# Patient Record
Sex: Male | Born: 1975
Health system: Southern US, Community
[De-identification: ages and names within clinical notes are randomized; demographics above are authoritative.]

## PROBLEM LIST (undated history)

## (undated) DIAGNOSIS — I10 Essential (primary) hypertension: Secondary | ICD-10-CM

## (undated) DIAGNOSIS — F209 Schizophrenia, unspecified: Secondary | ICD-10-CM

## (undated) DIAGNOSIS — R972 Elevated prostate specific antigen [PSA]: Secondary | ICD-10-CM

## (undated) DIAGNOSIS — J45909 Unspecified asthma, uncomplicated: Secondary | ICD-10-CM

## (undated) DIAGNOSIS — E785 Hyperlipidemia, unspecified: Secondary | ICD-10-CM

## (undated) HISTORY — DX: Essential (primary) hypertension: I10

## (undated) HISTORY — DX: Unspecified asthma, uncomplicated: J45.909

## (undated) HISTORY — DX: Schizophrenia, unspecified: F20.9

## (undated) HISTORY — DX: Hyperlipidemia, unspecified: E78.5

---

## 1998-09-02 ENCOUNTER — Emergency Department (HOSPITAL_COMMUNITY): Admission: EM | Admit: 1998-09-02 | Discharge: 1998-09-02 | Payer: Self-pay | Admitting: Emergency Medicine

## 2003-12-26 ENCOUNTER — Emergency Department (HOSPITAL_COMMUNITY): Admission: EM | Admit: 2003-12-26 | Discharge: 2003-12-26 | Payer: Self-pay | Admitting: Family Medicine

## 2005-01-20 ENCOUNTER — Emergency Department (HOSPITAL_COMMUNITY): Admission: EM | Admit: 2005-01-20 | Discharge: 2005-01-20 | Payer: Self-pay | Admitting: Emergency Medicine

## 2005-09-28 IMAGING — CR DG FOREARM 2V*R*
2 series · 2 of 2 positions shown · non-contrast
Comparison: none

CLINICAL DATA: Pain and swelling.  Trauma.
 FOREARM TWO VIEWS, RIGHT
 No fracture, dislocation, or bone destruction.  Joint spaces are preserved.  Mineralization normal.
 IMPRESSION 
 No acute bony abnormalities.

[view not recorded (1 of 2)]
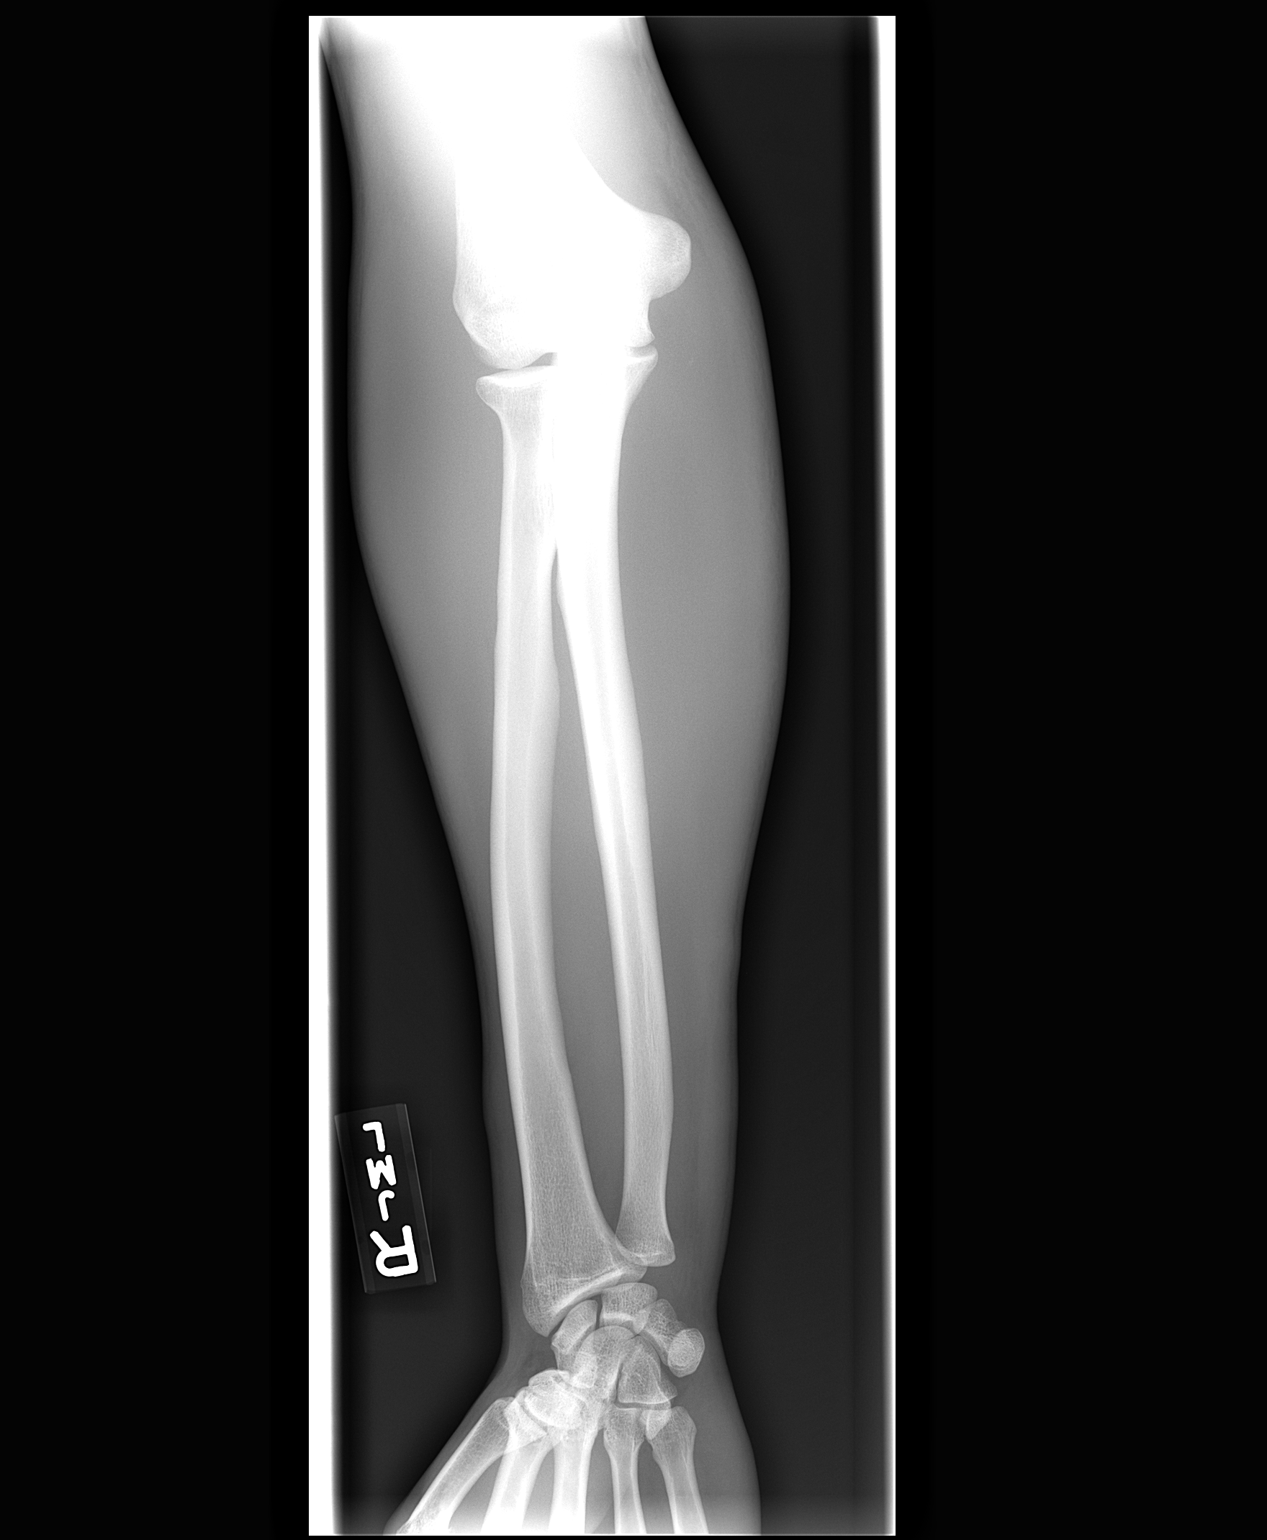

[view not recorded (2 of 2)]
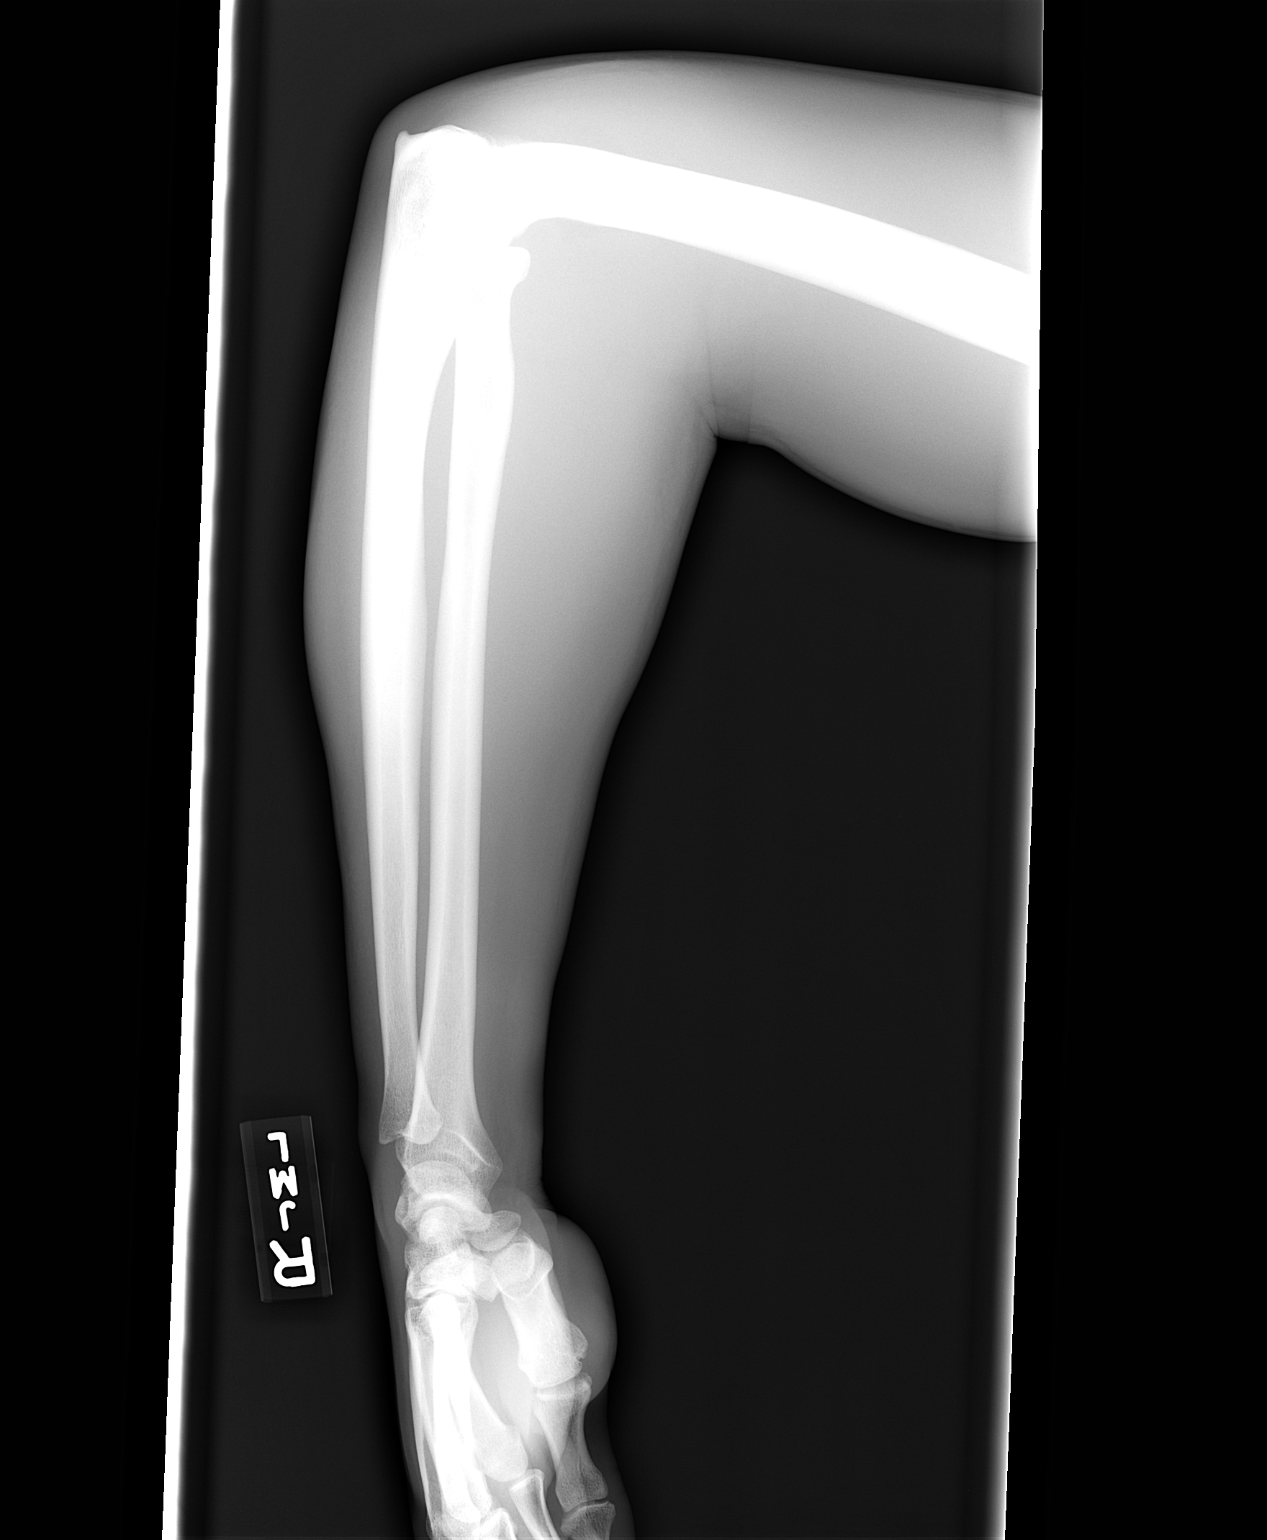

[2 of 2 positions shown; findings below may reference images not displayed]

## 2009-07-30 ENCOUNTER — Ambulatory Visit: Payer: Self-pay | Admitting: Psychiatry

## 2009-07-30 ENCOUNTER — Inpatient Hospital Stay (HOSPITAL_COMMUNITY): Admission: RE | Admit: 2009-07-30 | Discharge: 2009-08-18 | Payer: Self-pay | Admitting: Psychiatry

## 2009-07-30 ENCOUNTER — Emergency Department (HOSPITAL_COMMUNITY): Admission: EM | Admit: 2009-07-30 | Discharge: 2009-07-30 | Payer: Self-pay | Admitting: Emergency Medicine

## 2010-09-25 LAB — CBC
HCT: 48.7 % (ref 39.0–52.0)
MCHC: 33.1 g/dL (ref 30.0–36.0)
MCV: 97.1 fL (ref 78.0–100.0)
Platelets: 382 10*3/uL (ref 150–400)

## 2010-09-25 LAB — RAPID URINE DRUG SCREEN, HOSP PERFORMED
Amphetamines: NOT DETECTED
Benzodiazepines: NOT DETECTED
Cocaine: NOT DETECTED
Opiates: NOT DETECTED

## 2010-09-25 LAB — BASIC METABOLIC PANEL
CO2: 28 mEq/L (ref 19–32)
Calcium: 9.5 mg/dL (ref 8.4–10.5)
Creatinine, Ser: 0.99 mg/dL (ref 0.4–1.5)
Glucose, Bld: 130 mg/dL — ABNORMAL HIGH (ref 70–99)

## 2010-09-25 LAB — DIFFERENTIAL
Basophils Absolute: 0 10*3/uL (ref 0.0–0.1)
Basophils Relative: 1 % (ref 0–1)
Eosinophils Absolute: 0.2 10*3/uL (ref 0.0–0.7)
Monocytes Absolute: 0.7 10*3/uL (ref 0.1–1.0)
Monocytes Relative: 8 % (ref 3–12)

## 2010-09-25 LAB — HEPATIC FUNCTION PANEL
Albumin: 4.3 g/dL (ref 3.5–5.2)
Alkaline Phosphatase: 61 U/L (ref 39–117)
Indirect Bilirubin: 0.5 mg/dL (ref 0.3–0.9)
Total Protein: 8.2 g/dL (ref 6.0–8.3)

## 2010-09-25 LAB — RPR: RPR Ser Ql: NONREACTIVE

## 2011-05-03 IMAGING — CT CT HEAD W/O CM
1 series · 16 of 30 positions shown, 20 images · non-contrast
Comparison: None.

CLINICAL DATA: Medical clearance.

CT HEAD WITHOUT CONTRAST
TECHNIQUE: Contiguous axial images were obtained from the base of
the skull through the vertex without contrast.

[Series 2: headseq 4.8 h45s · axial · 0.43mm/px · z∈[-132,-4]mm · 16 of 30 slices shown, 20 images]
[im 2/30  brain]
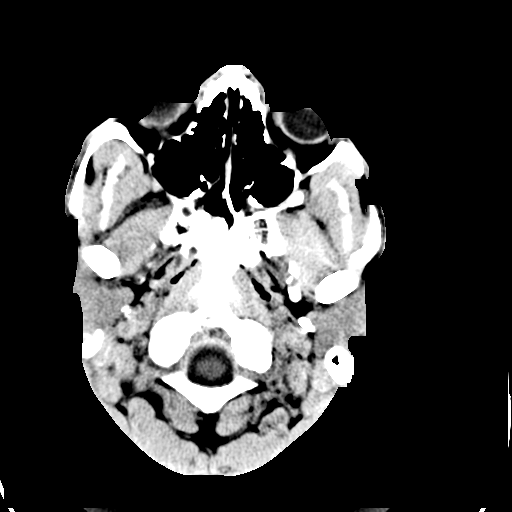
[im 2/30  bone]
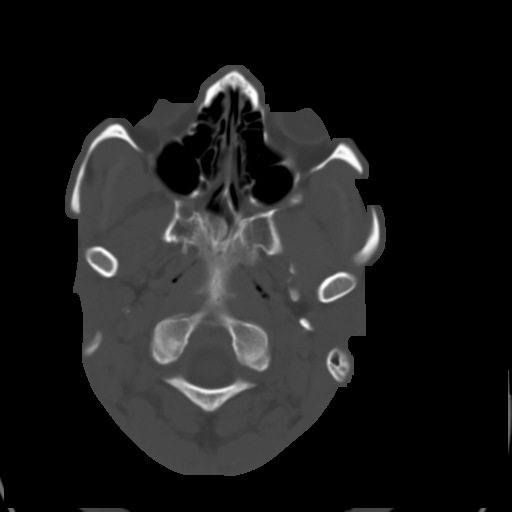
[im 4/30  brain]
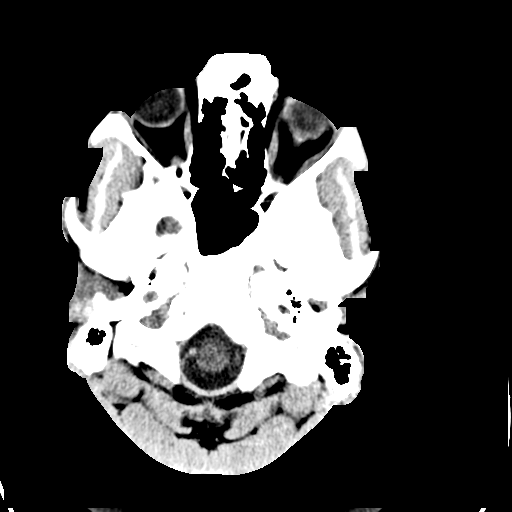
[im 6/30  brain]
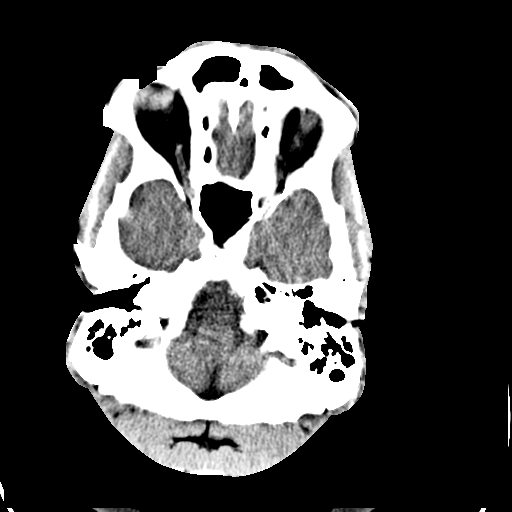
[im 8/30  brain]
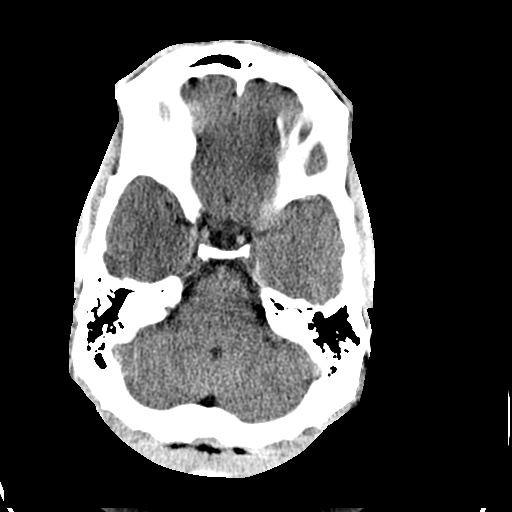
[im 9/30  brain]
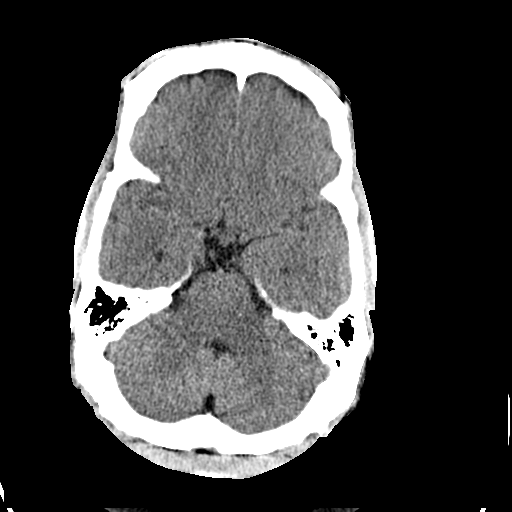
[im 9/30  bone]
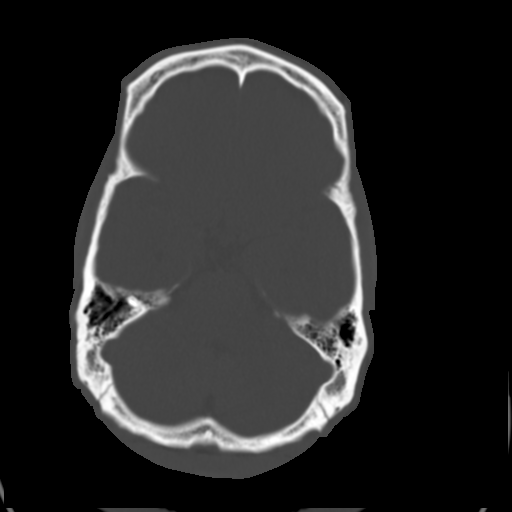
[im 11/30  brain]
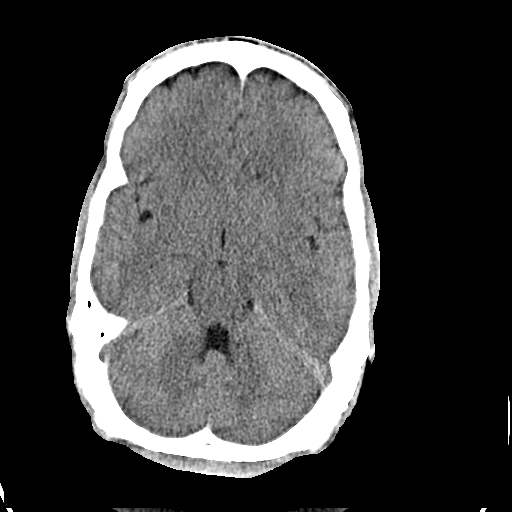
[im 13/30  brain]
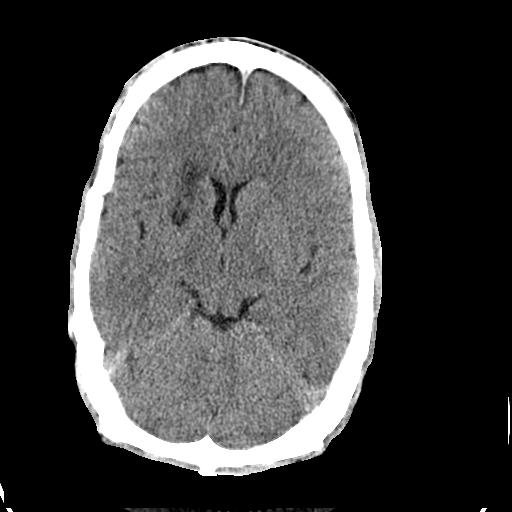
[im 15/30  brain]
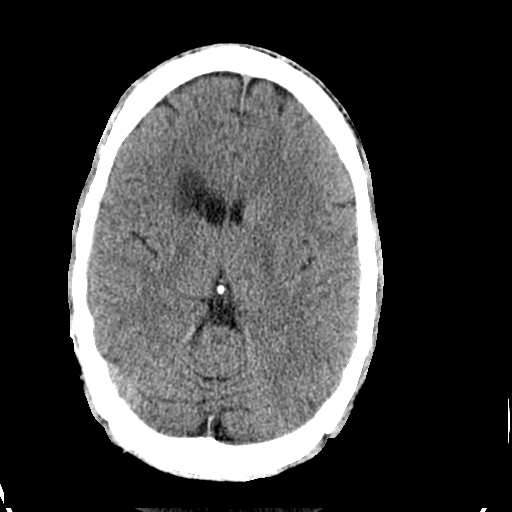
[im 16/30  brain]
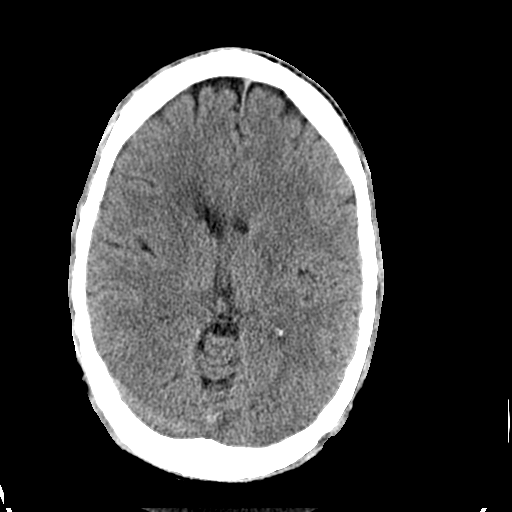
[im 16/30  bone]
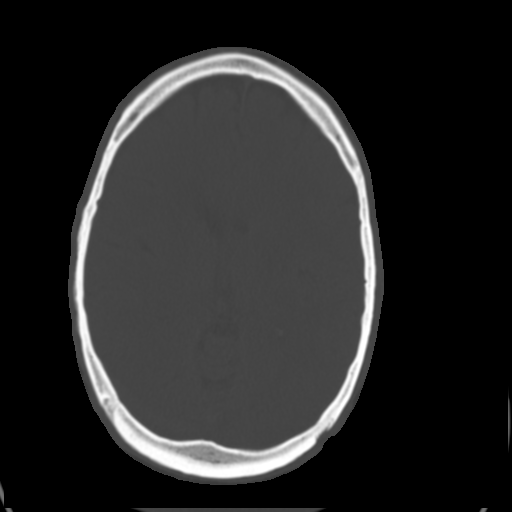
[im 18/30  brain]
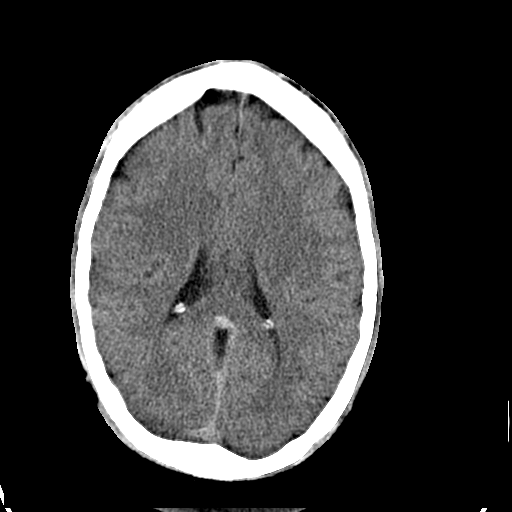
[im 20/30  brain]
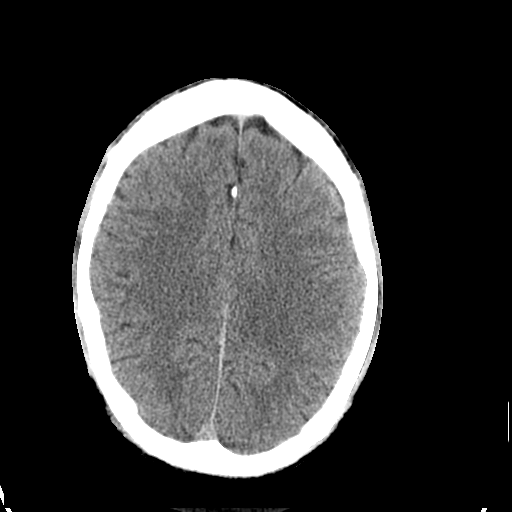
[im 22/30  brain]
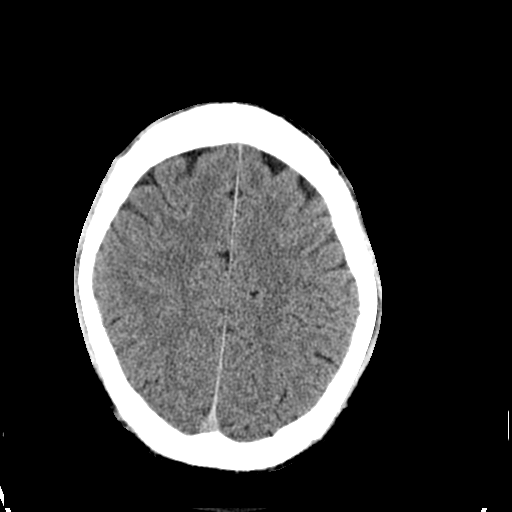
[im 23/30  brain]
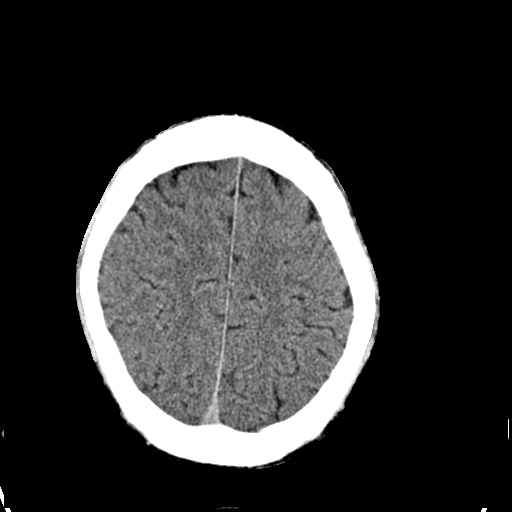
[im 23/30  bone]
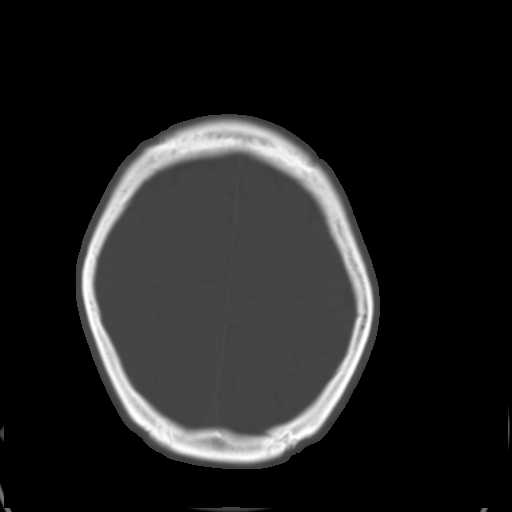
[im 25/30  brain]
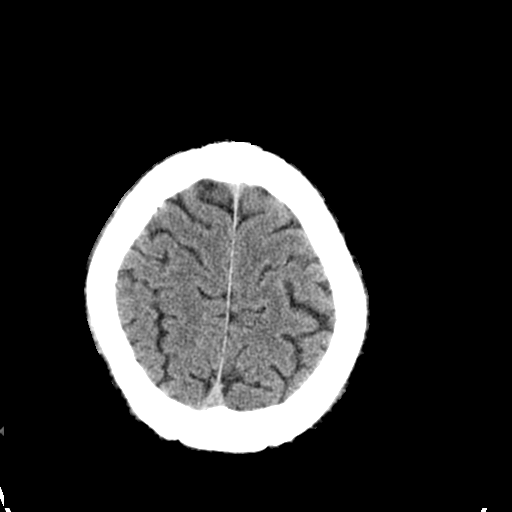
[im 27/30  brain]
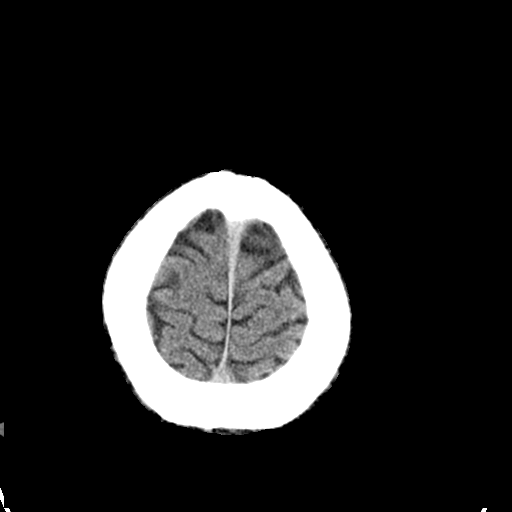
[im 29/30  brain]
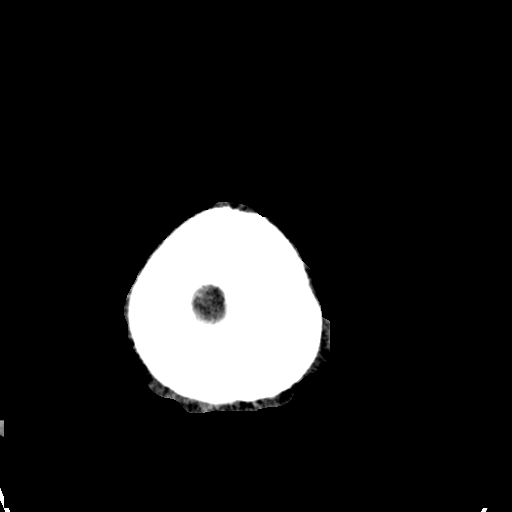

[16 of 30 positions shown; findings below may reference images not displayed]

FINDINGS: Moderately large low density area in the right frontal
periventricular white matter is most compatible with chronic
infarct.  Correlation with history and  prior imaging studies would
be useful.  No other infarcts are present.  There is no hemorrhage
or mass lesion.  Ventricle size is normal.  Calvarium is normal.
IMPRESSION: Hypodensity in the right frontal periventricular white matter and
right basal ganglia.  This is most likely due to chronic
infarction.  No acute abnormality.

## 2019-04-21 DIAGNOSIS — F209 Schizophrenia, unspecified: Secondary | ICD-10-CM | POA: Diagnosis not present

## 2019-04-22 DIAGNOSIS — E7800 Pure hypercholesterolemia, unspecified: Secondary | ICD-10-CM | POA: Diagnosis not present

## 2019-04-22 DIAGNOSIS — Z0000 Encounter for general adult medical examination without abnormal findings: Secondary | ICD-10-CM | POA: Diagnosis not present

## 2019-04-22 DIAGNOSIS — Z1159 Encounter for screening for other viral diseases: Secondary | ICD-10-CM | POA: Diagnosis not present

## 2019-04-22 DIAGNOSIS — I10 Essential (primary) hypertension: Secondary | ICD-10-CM | POA: Diagnosis not present

## 2019-04-29 DIAGNOSIS — F209 Schizophrenia, unspecified: Secondary | ICD-10-CM | POA: Diagnosis not present

## 2019-04-29 DIAGNOSIS — I10 Essential (primary) hypertension: Secondary | ICD-10-CM | POA: Diagnosis not present

## 2019-04-29 DIAGNOSIS — Z87891 Personal history of nicotine dependence: Secondary | ICD-10-CM | POA: Diagnosis not present

## 2019-04-29 DIAGNOSIS — J4540 Moderate persistent asthma, uncomplicated: Secondary | ICD-10-CM | POA: Diagnosis not present

## 2019-04-29 DIAGNOSIS — E7800 Pure hypercholesterolemia, unspecified: Secondary | ICD-10-CM | POA: Diagnosis not present

## 2019-04-29 DIAGNOSIS — Z79899 Other long term (current) drug therapy: Secondary | ICD-10-CM | POA: Diagnosis not present

## 2019-04-29 DIAGNOSIS — E782 Mixed hyperlipidemia: Secondary | ICD-10-CM | POA: Insufficient documentation

## 2019-06-02 DIAGNOSIS — F209 Schizophrenia, unspecified: Secondary | ICD-10-CM | POA: Diagnosis not present

## 2019-06-17 DIAGNOSIS — F209 Schizophrenia, unspecified: Secondary | ICD-10-CM | POA: Diagnosis not present

## 2019-06-30 DIAGNOSIS — F209 Schizophrenia, unspecified: Secondary | ICD-10-CM | POA: Diagnosis not present

## 2019-07-15 DIAGNOSIS — Z87891 Personal history of nicotine dependence: Secondary | ICD-10-CM | POA: Diagnosis not present

## 2019-07-15 DIAGNOSIS — R000 Tachycardia, unspecified: Secondary | ICD-10-CM | POA: Diagnosis not present

## 2019-07-15 DIAGNOSIS — F209 Schizophrenia, unspecified: Secondary | ICD-10-CM | POA: Diagnosis not present

## 2019-07-15 DIAGNOSIS — R6889 Other general symptoms and signs: Secondary | ICD-10-CM | POA: Diagnosis not present

## 2019-07-28 DIAGNOSIS — F209 Schizophrenia, unspecified: Secondary | ICD-10-CM | POA: Diagnosis not present

## 2019-08-25 DIAGNOSIS — F209 Schizophrenia, unspecified: Secondary | ICD-10-CM | POA: Diagnosis not present

## 2019-09-07 DIAGNOSIS — F209 Schizophrenia, unspecified: Secondary | ICD-10-CM | POA: Diagnosis not present

## 2019-09-22 DIAGNOSIS — F209 Schizophrenia, unspecified: Secondary | ICD-10-CM | POA: Diagnosis not present

## 2019-10-20 DIAGNOSIS — F209 Schizophrenia, unspecified: Secondary | ICD-10-CM | POA: Diagnosis not present

## 2019-10-26 DIAGNOSIS — Z125 Encounter for screening for malignant neoplasm of prostate: Secondary | ICD-10-CM | POA: Diagnosis not present

## 2019-10-26 DIAGNOSIS — E7800 Pure hypercholesterolemia, unspecified: Secondary | ICD-10-CM | POA: Diagnosis not present

## 2019-10-28 DIAGNOSIS — R972 Elevated prostate specific antigen [PSA]: Secondary | ICD-10-CM | POA: Insufficient documentation

## 2019-10-28 DIAGNOSIS — Z0000 Encounter for general adult medical examination without abnormal findings: Secondary | ICD-10-CM | POA: Insufficient documentation

## 2019-10-28 DIAGNOSIS — R7401 Elevation of levels of liver transaminase levels: Secondary | ICD-10-CM | POA: Diagnosis not present

## 2019-10-28 DIAGNOSIS — R05 Cough: Secondary | ICD-10-CM | POA: Diagnosis not present

## 2019-11-12 DIAGNOSIS — R7401 Elevation of levels of liver transaminase levels: Secondary | ICD-10-CM | POA: Diagnosis not present

## 2019-11-17 DIAGNOSIS — J4550 Severe persistent asthma, uncomplicated: Secondary | ICD-10-CM | POA: Diagnosis not present

## 2019-11-19 DIAGNOSIS — J4550 Severe persistent asthma, uncomplicated: Secondary | ICD-10-CM | POA: Diagnosis not present

## 2019-11-20 DIAGNOSIS — J4550 Severe persistent asthma, uncomplicated: Secondary | ICD-10-CM | POA: Diagnosis not present

## 2019-12-14 DIAGNOSIS — J4550 Severe persistent asthma, uncomplicated: Secondary | ICD-10-CM | POA: Diagnosis not present

## 2019-12-21 DIAGNOSIS — J4550 Severe persistent asthma, uncomplicated: Secondary | ICD-10-CM | POA: Diagnosis not present

## 2020-03-22 DIAGNOSIS — J4550 Severe persistent asthma, uncomplicated: Secondary | ICD-10-CM | POA: Diagnosis not present

## 2020-04-20 DIAGNOSIS — R6889 Other general symptoms and signs: Secondary | ICD-10-CM | POA: Diagnosis not present

## 2020-04-21 DIAGNOSIS — J4550 Severe persistent asthma, uncomplicated: Secondary | ICD-10-CM | POA: Diagnosis not present

## 2020-04-28 ENCOUNTER — Encounter: Payer: Self-pay | Admitting: Psychiatry

## 2020-04-28 ENCOUNTER — Telehealth (INDEPENDENT_AMBULATORY_CARE_PROVIDER_SITE_OTHER): Payer: Medicare HMO | Admitting: Psychiatry

## 2020-04-28 ENCOUNTER — Other Ambulatory Visit: Payer: Self-pay

## 2020-04-28 DIAGNOSIS — Z9189 Other specified personal risk factors, not elsewhere classified: Secondary | ICD-10-CM | POA: Diagnosis not present

## 2020-04-28 DIAGNOSIS — F209 Schizophrenia, unspecified: Secondary | ICD-10-CM

## 2020-04-28 MED ORDER — RISPERIDONE 4 MG PO TABS: 4.0000 mg | ORAL_TABLET | Freq: Every day | ORAL | 1 refills | Status: DC

## 2020-04-28 MED ORDER — BENZTROPINE MESYLATE 1 MG PO TABS: 1.0000 mg | ORAL_TABLET | Freq: Every day | ORAL | 1 refills | Status: DC | PRN

## 2020-04-28 NOTE — Progress Notes (Signed)
Virtual Visit via Video Note  I connected with William RoeLarry E Oconnor on 04/28/20 at  1:00 PM EDT by a video enabled telemedicine application and verified that I am speaking with the correct person using two identifiers.  Location Provider Location : [facility] Patient Location : Home  Participants: Patient , Provider    I discussed the limitations of evaluation and management by telemedicine and the availability of in person appointments. The patient expressed understanding and agreed to proceed.    I discussed the assessment and treatment plan with the patient. The patient was provided an opportunity to ask questions and all were answered. The patient agreed with the plan and demonstrated an understanding of the instructions.   The patient was advised to call back or seek an in-person evaluation if the symptoms worsen or if the condition fails to improve as anticipated.    Psychiatric Initial Adult Assessment   Patient Identification: William Oconnor MRN:  [id number] Date of Evaluation:  04/28/2020 Referral Source: [facility]  Chief Complaint:   Chief Complaint    Establish Care     Visit Diagnosis:    ICD-10-CM   1. Schizophrenia, chronic condition (HCC)  F20.9 benztropine (COGENTIN) 1 MG tablet    risperidone (RISPERDAL) 4 MG tablet  2. At risk for prolonged QT interval syndrome  Z91.89     History of Present Illness:  William RoeLarry E Abello is a 44 year old African-American male, single, lives in [place], on disability, has a history of schizophrenia, hypertension, hyperlipidemia was evaluated by telemedicine today.  Patient was under the care of [facility] previously however had to transition to our clinic for medication management due to health insurance plan problems.  Patient today reports his mood symptoms are currently stable on the current medication regimen.  He was previously diagnosed with schizophrenia.  This was several years ago.  He reports he has a history of hearing voices, seeing things  in the past.  However he is currently taking risperidone 4 mg.  He reports if he continues to take his medications he does not have any hallucinations or mood swings.  Patient denies any depressive symptoms at this time.  Patient denies any anxiety attacks or generalized worry about different things.  Patient reports sleep is good.  Patient does report a history of an accident at work.  He reports a shelf fell on his head and he had a head injury.  Patient however reports he had mental health problems prior to the head injury.  Patient denies any other history of trauma.  Patient denies any suicidality, homicidality.  Patient currently denies any auditory hallucinations or visual hallucinations or paranoia.  He did not seem to be preoccupied with any delusions.  Patient reports he is in a relationship with his girlfriend.  He also has good support system from his sisters.   Associated Signs/Symptoms: Depression Symptoms:  Denies (Hypo) Manic Symptoms:  Denies Anxiety Symptoms:  Denies Psychotic Symptoms:  Delusions, Hallucinations: Auditory Visual - Currently stable PTSD Symptoms: Negative  Past Psychiatric History: Patient was under the care of [facility].  Patient was diagnosed with schizophrenia.  Prior to that he was in [place] and was receiving services from [facility].  Patient reports he had 1 admission at [facility] in [place]-this may have been in 2011.  Patient per review of medical records at that time was delusional, homicidal and was given a diagnosis of psychosis as well as cannabis use disorder.  Patient denies any suicide attempts. Patient with  history of TBI.   Previous Psychotropic Medications: Yes Risperidone, Cogentin  Substance Abuse History in the last 12 months:  No.  Patient reports remote history of cannabis abuse around the age of 40.  He may have used it for 4 to 5 years and stopped taking it.  Consequences of Substance  Abuse: Negative  Past Medical History:  Past Medical History:  Diagnosis Date  . Asthma   . Hyperlipidemia   . Hypertension   . Schizophrenia (HCC)    History reviewed. No pertinent surgical history.  Family Psychiatric History: Mother-schizophrenia  Family History:  Family History  Problem Relation Age of Onset  . Schizophrenia Mother     Social History:   Social History   Socioeconomic History  . Marital status: Single    Spouse name: Not on file  . Number of children: Not on file  . Years of education: 76 TH GRADE  . Highest education level: Not on file  Occupational History  . Not on file  Tobacco Use  . Smoking status: Former Smoker    Types: Cigarettes, Cigars    Quit date: 04/28/2010    Years since quitting: 10.0  . Smokeless tobacco: Never Used  Vaping Use  . Vaping Use: Never used  Substance and Sexual Activity  . Alcohol use: Not Currently  . Drug use: Not Currently    Types: Marijuana  . Sexual activity: Yes  Other Topics Concern  . Not on file  Social History Narrative  . Not on file   Social Determinants of Health   Financial Resource Strain:   . Difficulty of Paying Living Expenses: Not on file  Food Insecurity:   . Worried About Programme researcher, broadcasting/film/video in the Last Year: Not on file  . Ran Out of Food in the Last Year: Not on file  Transportation Needs:   . Lack of Transportation (Medical): Not on file  . Lack of Transportation (Non-Medical): Not on file  Physical Activity:   . Days of Exercise per Week: Not on file  . Minutes of Exercise per Session: Not on file  Stress:   . Feeling of Stress : Not on file  Social Connections:   . Frequency of Communication with Friends and Family: Not on file  . Frequency of Social Gatherings with Friends and Family: Not on file  . Attends Religious Services: Not on file  . Active Member of Clubs or Organizations: Not on file  . Attends Banker Meetings: Not on file  . Marital Status: Not  on file    Additional Social History: Patient was born in [place].  He grew up in [place].  Patient graduated high school.  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  He is currently on Social Security disability.  Patient reports his sisters are supportive.  He is single however reports he is in a relationship which is going well.  Patient currently lives by himself in [place].  His mother is deceased.  His father currently lives in [place]. Allergies:   Allergies  Allergen Reactions  . Atorvastatin Other (See Comments)    Transaminitis     Metabolic Disorder Labs: No results found for: HGBA1C, MPG No results found for: PROLACTIN No results found for: CHOL, TRIG, HDL, CHOLHDL, VLDL, LDLCALC Lab Results  Component Value Date   TSH 1.009 Test methodology is 3rd generation TSH 07/31/2009    Therapeutic Level Labs: No results found  for: LITHIUM No results found for: CBMZ No results found for: VALPROATE  Current Medications: Current Outpatient Medications  Medication Sig Dispense Refill  . albuterol (VENTOLIN HFA) 108 (90 Base) MCG/ACT inhaler Inhale into the lungs.    Marland Kitchen amLODipine (NORVASC) 10 MG tablet     . benztropine (COGENTIN) 1 MG tablet Take 1 tablet (1 mg total) by mouth daily as needed for tremors. And side effects of risperidone 90 tablet 1  . Calcium Carbonate-Vitamin D 600-400 MG-UNIT tablet Take 2 tablets by mouth daily.    . fluticasone (FLONASE) 50 MCG/ACT nasal spray Place into the nose.    . fluticasone furoate-vilanterol (BREO ELLIPTA) 100-25 MCG/INH AEPB Inhale into the lungs.    . pantoprazole (PROTONIX) 20 MG tablet Take by mouth.    . pravastatin (PRAVACHOL) 40 MG tablet Take by mouth.    . risperidone (RISPERDAL) 4 MG tablet Take 1 tablet (4 mg total) by mouth at bedtime. 90 tablet 1   No current facility-administered medications for this visit.    Musculoskeletal: Strength & Muscle Tone: UTA Gait & Station:  normal Patient leans: N/A  Psychiatric Specialty Exam: Review of Systems  Psychiatric/Behavioral: Negative for agitation, behavioral problems, confusion, decreased concentration, dysphoric mood, hallucinations, self-injury, sleep disturbance and suicidal ideas. The patient is not nervous/anxious and is not hyperactive.   All other systems reviewed and are negative.   There were no vitals taken for this visit.There is no height or weight on file to calculate BMI.  General Appearance: Casual  Eye Contact:  Fair  Speech:  Clear and Coherent  Volume:  Normal  Mood:  Euthymic  Affect:  Congruent  Thought Process:  Goal Directed and Descriptions of Associations: Intact  Orientation:  Full (Time, Place, and Person)  Thought Content:  Logical  Suicidal Thoughts:  No  Homicidal Thoughts:  No  Memory:  Immediate;   Fair Recent;   Fair Remote;   Fair  Judgement:  Fair  Insight:  Fair  Psychomotor Activity:  Normal  Concentration:  Concentration: Fair and Attention Span: Fair  Recall:  [employer] of Knowledge:Fair  Language: Fair  Akathisia:  No  Handed:  Right  AIMS (if indicated):  UTA  Assets:  Communication Skills Desire for Improvement Housing Social Support  ADL's:  Intact  Cognition: WNL  Sleep:  Fair   Screenings:   Assessment and Plan: TEVON BERHANE is a 44 year old African-American male who has a history of schizophrenia, hypertension, hyperlipidemia was evaluated by telemedicine today.  Patient with chronic history of schizophrenia is currently stable on risperidone.  Plan as noted below.  Plan Schizophrenia-stable Continue risperidone 4 mg p.o. nightly Continue Cogentin 1 mg as needed for side effects of risperidone.  At risk for prolonged QT syndrome-patient will benefit from EKG.  He will have a discussion with primary care provider.  Patient will also benefit from [facility] labs.  He will get it at his primary care office.  I have reviewed the following labs in E  HR dated 04/22/2019-hemoglobin A1c-within normal limits.  Lipid panel-10/26/2019-elevated-patient is currently on treatment for the same. He also has elevated AST/ALT-dated 10/26/2019-he reports it may have been due to a reaction that he had to atorvastatin and he was taken off of it.   I have reviewed medical records from [facility] by Dr.Melissa Erik Obey -dated 09/07/2018, 01/14/2020-patient with history of schizophrenia-continued on risperidone and benztropine. Follow-up in clinic in 3 months or sooner if needed.  I have spent atleast 45 minutes  face to face by video with patient today. More than 50 % of the time was spent for preparing to see the patient ( e.g., review of test, records ), obtaining and to review and separately obtained history , ordering medications and test ,psychoeducation and supportive psychotherapy and care coordination,as well as documenting clinical information in electronic health record. This note was generated in part or whole with voice recognition software. Voice recognition is usually quite accurate but there are transcription errors that can and very often do occur. I apologize for any typographical errors that were not detected and corrected.        Jomarie Longs, MD 10/22/20218:21 AM

## 2020-05-10 DIAGNOSIS — I10 Essential (primary) hypertension: Secondary | ICD-10-CM | POA: Diagnosis not present

## 2020-05-10 DIAGNOSIS — E782 Mixed hyperlipidemia: Secondary | ICD-10-CM | POA: Diagnosis not present

## 2020-05-10 DIAGNOSIS — Z79899 Other long term (current) drug therapy: Secondary | ICD-10-CM | POA: Diagnosis not present

## 2020-05-10 DIAGNOSIS — F209 Schizophrenia, unspecified: Secondary | ICD-10-CM | POA: Diagnosis not present

## 2020-05-20 DIAGNOSIS — E782 Mixed hyperlipidemia: Secondary | ICD-10-CM | POA: Diagnosis not present

## 2020-05-20 DIAGNOSIS — J4540 Moderate persistent asthma, uncomplicated: Secondary | ICD-10-CM | POA: Diagnosis not present

## 2020-05-20 DIAGNOSIS — Z23 Encounter for immunization: Secondary | ICD-10-CM | POA: Diagnosis not present

## 2020-05-20 DIAGNOSIS — F209 Schizophrenia, unspecified: Secondary | ICD-10-CM | POA: Diagnosis not present

## 2020-05-20 DIAGNOSIS — I10 Essential (primary) hypertension: Secondary | ICD-10-CM | POA: Diagnosis not present

## 2020-05-22 DIAGNOSIS — J4550 Severe persistent asthma, uncomplicated: Secondary | ICD-10-CM | POA: Diagnosis not present

## 2020-05-27 ENCOUNTER — Telehealth: Payer: Self-pay | Admitting: Psychiatry

## 2020-05-27 NOTE — Telephone Encounter (Signed)
I have reviewed EKG dated 05/20/2020-normal sinus rhythm.  QTC-within normal limits.

## 2020-06-21 DIAGNOSIS — J4550 Severe persistent asthma, uncomplicated: Secondary | ICD-10-CM | POA: Diagnosis not present

## 2020-06-22 DIAGNOSIS — J358 Other chronic diseases of tonsils and adenoids: Secondary | ICD-10-CM | POA: Diagnosis not present

## 2020-06-22 DIAGNOSIS — J392 Other diseases of pharynx: Secondary | ICD-10-CM | POA: Diagnosis not present

## 2020-07-06 DIAGNOSIS — J358 Other chronic diseases of tonsils and adenoids: Secondary | ICD-10-CM | POA: Diagnosis not present

## 2020-07-06 DIAGNOSIS — R070 Pain in throat: Secondary | ICD-10-CM | POA: Diagnosis not present

## 2020-07-28 ENCOUNTER — Telehealth (INDEPENDENT_AMBULATORY_CARE_PROVIDER_SITE_OTHER): Payer: Medicare Other | Admitting: Psychiatry

## 2020-07-28 ENCOUNTER — Other Ambulatory Visit: Payer: Self-pay

## 2020-07-28 ENCOUNTER — Encounter: Payer: Self-pay | Admitting: Psychiatry

## 2020-07-28 DIAGNOSIS — F209 Schizophrenia, unspecified: Secondary | ICD-10-CM | POA: Diagnosis not present

## 2020-07-28 DIAGNOSIS — Z9189 Other specified personal risk factors, not elsewhere classified: Secondary | ICD-10-CM | POA: Diagnosis not present

## 2020-07-28 MED ORDER — BENZTROPINE MESYLATE 1 MG PO TABS: 1.0000 mg | ORAL_TABLET | Freq: Every day | ORAL | 1 refills | Status: DC | PRN

## 2020-07-28 MED ORDER — RISPERIDONE 4 MG PO TABS: 4.0000 mg | ORAL_TABLET | Freq: Every day | ORAL | 1 refills | Status: DC

## 2020-07-28 NOTE — Progress Notes (Signed)
Virtual Visit via Video Note  I connected with William Oconnor on 07/28/20 at  2:00 PM EST by a video enabled telemedicine application and verified that I am speaking with the correct person using two identifiers.  Location Provider Location : [facility] Patient Location : Home  Participants: Patient , Provider   I discussed the limitations of evaluation and management by telemedicine and the availability of in person appointments. The patient expressed understanding and agreed to proceed.   I discussed the assessment and treatment plan with the patient. The patient was provided an opportunity to ask questions and all were answered. The patient agreed with the plan and demonstrated an understanding of the instructions.   The patient was advised to call back or seek an in-person evaluation if the symptoms worsen or if the condition fails to improve as anticipated.  [facility] MD OP Progress Note  07/28/2020 5:24 PM William Oconnor  MRN:  [id number]  Chief Complaint:  Chief Complaint    Follow-up     HPI: William Oconnor is a 45 year old African-American male, single, lives in [place], on disability, has a history of schizophrenia, hypertension, hyperlipidemia was evaluated by telemedicine today.  Patient today reports he is currently doing well with regards to his mood.  Denies any mood swings.  Denies any psychosis.  Patient reports sleep as good.  Patient denies any suicidality, homicidality.  Patient reports he is compliant on the risperidone and the benztropine.  Denies side effects.  Patient reports he continues to have good support system from his sister.  Patient reports he broke up with his girlfriend however he feels okay.  Patient reports his sister comes in and takes him to places and that helps.  He reports he is not interested in starting psychotherapy sessions at this time.  Patient reports he had adverse reaction to his atorvastatin which caused his liver enzymes to go up.  He  reports he was hence taken off of it and was advised to not use Tylenol.  Patient reports he continues to follow-up with his primary care provider.  Patient denies any other concerns today.  Visit Diagnosis:    ICD-10-CM   1. Schizophrenia, chronic condition (HCC)  F20.9 risperidone (RISPERDAL) 4 MG tablet    benztropine (COGENTIN) 1 MG tablet  2. At risk for prolonged QT interval syndrome  Z91.89     Past Psychiatric History: I have reviewed past psychiatric history from my progress note on 04/28/2020.  Past trials of medications like risperidone, Cogentin.  Past Medical History:  Past Medical History:  Diagnosis Date  . Asthma   . Hyperlipidemia   . Hypertension   . Schizophrenia (HCC)    History reviewed. No pertinent surgical history.  Family Psychiatric History: I have reviewed family psychiatric history from my progress note on 04/28/2020  Family History:  Family History  Problem Relation Age of Onset  . Schizophrenia Mother     Social History: Reviewed social history from my progress note on 04/28/2020 Social History   Socioeconomic History  . Marital status: Single    Spouse name: Not on file  . Number of children: Not on file  . Years of education: 50 TH GRADE  . Highest education level: Not on file  Occupational History  . Not on file  Tobacco Use  . Smoking status: Former Smoker    Types: Cigarettes, Cigars    Quit date: 04/28/2010    Years since quitting: 10.2  . Smokeless tobacco: Never Used  Vaping Use  . Vaping Use: Never used  Substance and Sexual Activity  . Alcohol use: Not Currently  . Drug use: Not Currently    Types: Marijuana  . Sexual activity: Yes  Other Topics Concern  . Not on file  Social History Narrative  . Not on file   Social Determinants of Health   Financial Resource Strain: Not on file  Food Insecurity: Not on file  Transportation Needs: Not on file  Physical Activity: Not on file  Stress: Not on file  Social  Connections: Not on file    Allergies:  Allergies  Allergen Reactions  . Atorvastatin Other (See Comments)    Transaminitis     Metabolic Disorder Labs: No results found for: HGBA1C, MPG No results found for: PROLACTIN No results found for: CHOL, TRIG, HDL, CHOLHDL, VLDL, LDLCALC Lab Results  Component Value Date   TSH 1.009 Test methodology is 3rd generation TSH 07/31/2009    Therapeutic Level Labs: No results found for: LITHIUM No results found for: VALPROATE No components found for:  CBMZ  Current Medications: Current Outpatient Medications  Medication Sig Dispense Refill  . albuterol (VENTOLIN HFA) 108 (90 Base) MCG/ACT inhaler Inhale into the lungs.    Marland Kitchen amLODipine (NORVASC) 10 MG tablet     . Calcium Carbonate-Vitamin D 600-400 MG-UNIT tablet Take 2 tablets by mouth daily.    . fluticasone (FLONASE) 50 MCG/ACT nasal spray Place into the nose.    . fluticasone furoate-vilanterol (BREO ELLIPTA) 100-25 MCG/INH AEPB Inhale into the lungs.    . pravastatin (PRAVACHOL) 40 MG tablet Take by mouth.    . benztropine (COGENTIN) 1 MG tablet Take 1 tablet (1 mg total) by mouth daily as needed for tremors. And side effects of risperidone 90 tablet 1  . risperidone (RISPERDAL) 4 MG tablet Take 1 tablet (4 mg total) by mouth at bedtime. 90 tablet 1   No current facility-administered medications for this visit.     Musculoskeletal: Strength & Muscle Tone: UTA Gait & Station: UTA Patient leans: N/A  Psychiatric Specialty Exam: Review of Systems  Psychiatric/Behavioral: Negative for agitation, behavioral problems, confusion, decreased concentration, dysphoric mood, hallucinations, self-injury, sleep disturbance and suicidal ideas. The patient is not nervous/anxious and is not hyperactive.   All other systems reviewed and are negative.   There were no vitals taken for this visit.There is no height or weight on file to calculate BMI.  General Appearance: Casual  Eye Contact:   Fair  Speech:  Clear and Coherent  Volume:  Normal  Mood:  Euthymic  Affect:  Congruent  Thought Process:  Goal Directed and Descriptions of Associations: Intact  Orientation:  Full (Time, Place, and Person)  Thought Content: Logical   Suicidal Thoughts:  No  Homicidal Thoughts:  No  Memory:  Immediate;   Fair Recent;   Fair Remote;   Fair  Judgement:  Fair  Insight:  Fair  Psychomotor Activity:  Normal  Concentration:  Concentration: Fair and Attention Span: Fair  Recall:  [employer] of Knowledge: Fair  Language: Fair  Akathisia:  No  Handed:  Right  AIMS (if indicated): UTA  Assets:  Communication Skills Desire for Improvement Housing Social Support  ADL's:  Intact  Cognition: WNL  Sleep:  Fair   Screenings:   Assessment and Plan: BARAKA KLATT is a 45 year old African-American male who has a history of schizophrenia, hypertension, hyperlipidemia was evaluated by telemedicine today.  Patient is currently stable on current medication regimen.  Plan as noted below.  Plan Schizophrenia-stable Risperidone 4 mg manage nightly Continue Cogentin 1 mg p.o. daily as needed for side effects of risperidone.  At risk for prolonged QT syndrome-reviewed EKG dated 05/20/2020-normal sinus rhythm.  Discussed with patient about referral for psychotherapy sessions, patient however is not interested at this time.  Patient to continue to follow-up with his primary care provider for elevated liver enzymes.  Follow-up in clinic in 2 to 3 months or sooner if needed.  I have spent atleast 20 minutes face to face by video with patient today. More than 50 % of the time was spent for preparing to see the patient ( e.g., review of test, records ), obtaining and to review and separately obtained history , ordering medications and test ,psychoeducation and supportive psychotherapy and care coordination,as well as documenting clinical information in electronic health record. This note was  generated in part or whole with voice recognition software. Voice recognition is usually quite accurate but there are transcription errors that can and very often do occur. I apologize for any typographical errors that were not detected and corrected.        Jomarie Longs, MD 07/28/2020, 5:24 PM

## 2020-10-13 ENCOUNTER — Telehealth (INDEPENDENT_AMBULATORY_CARE_PROVIDER_SITE_OTHER): Payer: Medicare Other | Admitting: Psychiatry

## 2020-10-13 ENCOUNTER — Other Ambulatory Visit: Payer: Self-pay

## 2020-10-13 ENCOUNTER — Encounter: Payer: Self-pay | Admitting: Psychiatry

## 2020-10-13 DIAGNOSIS — F209 Schizophrenia, unspecified: Secondary | ICD-10-CM

## 2020-10-13 NOTE — Progress Notes (Signed)
Virtual Visit via Video Note  I connected with William Oconnor on 10/13/20 at  2:20 PM EDT by a video enabled telemedicine application and verified that I am speaking with the correct person using two identifiers.  Location Provider Location : [facility] Patient Location : Home  Participants: Patient , Provider   I discussed the limitations of evaluation and management by telemedicine and the availability of in person appointments. The patient expressed understanding and agreed to proceed.   I discussed the assessment and treatment plan with the patient. The patient was provided an opportunity to ask questions and all were answered. The patient agreed with the plan and demonstrated an understanding of the instructions.   The patient was advised to call back or seek an in-person evaluation if the symptoms worsen or if the condition fails to improve as anticipated.   BH MD OP Progress Note  10/13/2020 5:57 PM William Oconnor  MRN:  [id number]  Chief Complaint:  Chief Complaint    Follow-up; Hallucinations     HPI: William Oconnor is a 45 year old African-American male, single, lives in [place], on disability, has a history of schizophrenia, hypertension, hyperlipidemia was evaluated by telemedicine today.  Patient today reports he is currently doing well with regards to his mood.  Denies any hallucinations.  Denies any paranoia.  Patient reports sleep as good.  He reports appetite is fair.  Patient is compliant on medications.  Denies side effects.  Patient reports he currently takes the benztropine on a daily basis.  He wonders whether he should do that or not.  Patient wants to know more about it.  Patient continues to have good support system from his family.  He denies any other concerns today.    Visit Diagnosis:    ICD-10-CM   1. Schizophrenia, chronic condition (HCC)  F20.9     Past Psychiatric History: I have reviewed past psychiatric history from my progress note on  04/28/2020.  Past trials of medications like risperidone, Cogentin.  Past Medical History:  Past Medical History:  Diagnosis Date  . Asthma   . Hyperlipidemia   . Hypertension   . Schizophrenia (HCC)    History reviewed. No pertinent surgical history.  Family Psychiatric History: I have reviewed family psychiatric history from my progress note on 04/28/2020  Family History:  Family History  Problem Relation Age of Onset  . Schizophrenia Mother     Social History: I have reviewed social history from my progress note on 04/28/2020 Social History   Socioeconomic History  . Marital status: Single    Spouse name: Not on file  . Number of children: Not on file  . Years of education: 1 TH GRADE  . Highest education level: Not on file  Occupational History  . Not on file  Tobacco Use  . Smoking status: Former Smoker    Types: Cigarettes, Cigars    Quit date: 04/28/2010    Years since quitting: 10.4  . Smokeless tobacco: Never Used  Vaping Use  . Vaping Use: Never used  Substance and Sexual Activity  . Alcohol use: Not Currently  . Drug use: Not Currently    Types: Marijuana  . Sexual activity: Yes  Other Topics Concern  . Not on file  Social History Narrative  . Not on file   Social Determinants of Health   Financial Resource Strain: Not on file  Food Insecurity: Not on file  Transportation Needs: Not on file  Physical Activity: Not on file  Stress: Not on file  Social Connections: Not on file    Allergies:  Allergies  Allergen Reactions  . Atorvastatin Other (See Comments)    Transaminitis     Metabolic Disorder Labs: No results found for: HGBA1C, MPG No results found for: PROLACTIN No results found for: CHOL, TRIG, HDL, CHOLHDL, VLDL, LDLCALC Lab Results  Component Value Date   TSH 1.009 Test methodology is 3rd generation TSH 07/31/2009    Therapeutic Level Labs: No results found for: LITHIUM No results found for: VALPROATE No components found  for:  CBMZ  Current Medications: Current Outpatient Medications  Medication Sig Dispense Refill  . naproxen (NAPROSYN) 500 MG tablet Take by mouth.    . pantoprazole (PROTONIX) 20 MG tablet Take 1 tablet by mouth 2 (two) times daily before a meal.    . albuterol (VENTOLIN HFA) 108 (90 Base) MCG/ACT inhaler Inhale into the lungs.    Marland Kitchen amLODipine (NORVASC) 10 MG tablet     . benztropine (COGENTIN) 1 MG tablet Take 1 tablet (1 mg total) by mouth daily as needed for tremors. And side effects of risperidone 90 tablet 1  . Calcium Carbonate-Vitamin D 600-400 MG-UNIT tablet Take 2 tablets by mouth daily.    . fluticasone (FLONASE) 50 MCG/ACT nasal spray Place into the nose.    . fluticasone furoate-vilanterol (BREO ELLIPTA) 100-25 MCG/INH AEPB Inhale into the lungs.    . pravastatin (PRAVACHOL) 40 MG tablet Take by mouth.    . risperidone (RISPERDAL) 4 MG tablet Take 1 tablet (4 mg total) by mouth at bedtime. 90 tablet 1   No current facility-administered medications for this visit.     Musculoskeletal: Strength & Muscle Tone: UTA Gait & Station: UTA Patient leans: N/A  Psychiatric Specialty Exam: Review of Systems  Psychiatric/Behavioral: Negative for agitation, behavioral problems, confusion, decreased concentration, dysphoric mood, hallucinations, self-injury, sleep disturbance and suicidal ideas. The patient is not nervous/anxious and is not hyperactive.   All other systems reviewed and are negative.   There were no vitals taken for this visit.There is no height or weight on file to calculate BMI.  General Appearance: Casual  Eye Contact:  Fair  Speech:  Clear and Coherent  Volume:  Normal  Mood:  Euthymic  Affect:  Congruent  Thought Process:  Goal Directed and Descriptions of Associations: Intact  Orientation:  Full (Time, Place, and Person)  Thought Content: Logical   Suicidal Thoughts:  No  Homicidal Thoughts:  No  Memory:  Immediate;   Fair Recent;   Fair Remote;   Fair   Judgement:  Fair  Insight:  Fair  Psychomotor Activity:  Normal  Concentration:  Concentration: Fair and Attention Span: Fair  Recall:  [employer] of Knowledge: Fair  Language: Fair  Akathisia:  No  Handed:  Right  AIMS (if indicated): UTA  Assets:  Communication Skills Desire for Improvement Housing Social Support  ADL's:  Intact  Cognition: WNL  Sleep:  Fair   Screenings:   Assessment and Plan: William Oconnor is a 45 year old African American male, has a history of schizophrenia, hypertension, hyperlipidemia was evaluated by telemedicine today.  Patient is currently stable on current medication regimen.  Plan as noted below.  Plan Schizophrenia-stable Risperidone 4 mg p.o. nightly Continue Cogentin 1 mg as needed for side effects of risperidone however advised patient to limit use and use it only if he needs it.  Patient provided medication education about benztropine.  Follow-up in clinic in person in 2 months.  This note was generated in part or whole with voice recognition software. Voice recognition is usually quite accurate but there are transcription errors that can and very often do occur. I apologize for any typographical errors that were not detected and corrected.        Jomarie Longs, MD 10/13/2020, 5:57 PM

## 2020-11-06 ENCOUNTER — Other Ambulatory Visit: Payer: Self-pay | Admitting: Psychiatry

## 2020-11-06 DIAGNOSIS — F209 Schizophrenia, unspecified: Secondary | ICD-10-CM

## 2020-11-21 DIAGNOSIS — Z0000 Encounter for general adult medical examination without abnormal findings: Secondary | ICD-10-CM | POA: Insufficient documentation

## 2020-12-20 ENCOUNTER — Other Ambulatory Visit
Admission: RE | Admit: 2020-12-20 | Discharge: 2020-12-20 | Disposition: A | Discharge status: Home / Self Care | Payer: Medicare Other | Source: Ambulatory Visit | Attending: Psychiatry | Admitting: Psychiatry

## 2020-12-20 ENCOUNTER — Ambulatory Visit (INDEPENDENT_AMBULATORY_CARE_PROVIDER_SITE_OTHER): Payer: Medicare Other | Admitting: Psychiatry

## 2020-12-20 ENCOUNTER — Other Ambulatory Visit: Payer: Self-pay

## 2020-12-20 ENCOUNTER — Encounter: Payer: Self-pay | Admitting: Psychiatry

## 2020-12-20 VITALS — BP 123/83 | HR 88 | Temp 98.3°F | Ht 70.87 in | Wt 197.0 lb

## 2020-12-20 DIAGNOSIS — Z79899 Other long term (current) drug therapy: Secondary | ICD-10-CM | POA: Insufficient documentation

## 2020-12-20 DIAGNOSIS — F209 Schizophrenia, unspecified: Secondary | ICD-10-CM

## 2020-12-20 NOTE — Progress Notes (Addendum)
BH MD OP Progress Note  12/20/2020 11:48 AM William Oconnor  MRN:  [id number]  Chief Complaint:  Chief Complaint   Follow-up; Hallucinations    HPI: William Oconnor is a 45 year old African-American male, single, lives in [place], on disability, has a history of schizophrenia, hypertension, hyperlipidemia was evaluated in office today.  Patient today reports he continues to have chronic auditory hallucinations which are command in nature.  He however reports the medications are helping him to cope with it.  He does not listen to these voices.  He takes a walk, listens to music or talks to his sister and that also helps him to cope with voices when they are worse.  He however reports overall he has been doing well.  Denies any suicidality, homicidality.  Denies any other perceptual disturbances.  He reports he currently does not have any sadness or anxiety symptoms.  Patient is compliant on medications.  He does have constipation.  He reports he however has been managing it okay and agrees to talk to his primary care provider if it gets worse.  Patient reports he takes the benztropine only as needed, a couple of times a week for tremors.  Completed an aims scale in session today, 0.  He currently does not have any muscle rigidity, tremors or abnormal involuntary movements.  Visit Diagnosis:    ICD-10-CM   1. Schizophrenia, chronic condition (HCC)  F20.9     2. High risk medication use  Z79.899 Prolactin      Past Psychiatric History: I have reviewed past psychiatric history from progress note on 04/28/2020. Past trials of medications- Risperidone Cogentin  Past Medical History:  Past Medical History:  Diagnosis Date   Asthma    Hyperlipidemia    Hypertension    Schizophrenia (HCC)    History reviewed. No pertinent surgical history.  Family Psychiatric History: I have reviewed family psychiatric history from progress note on 04/28/2020  Family History:  Family History   Problem Relation Age of Onset   Schizophrenia Mother     Social History: Reviewed social history from progress note on 04/28/2020 Social History   Socioeconomic History   Marital status: Single    Spouse name: Not on file   Number of children: Not on file   Years of education: 12 TH GRADE   Highest education level: Not on file  Occupational History   Not on file  Tobacco Use   Smoking status: Former    Pack years: 0.00    Types: Cigarettes, Cigars    Quit date: 04/28/2010    Years since quitting: 10.6   Smokeless tobacco: Never  Vaping Use   Vaping Use: Never used  Substance and Sexual Activity   Alcohol use: Not Currently   Drug use: Not Currently    Types: Marijuana   Sexual activity: Yes  Other Topics Concern   Not on file  Social History Narrative   Not on file   Social Determinants of Health   Financial Resource Strain: Not on file  Food Insecurity: Not on file  Transportation Needs: Not on file  Physical Activity: Not on file  Stress: Not on file  Social Connections: Not on file    Allergies:  Allergies  Allergen Reactions   Atorvastatin Other (See Comments)    Transaminitis     Metabolic Disorder Labs: No results found for: HGBA1C, MPG No results found for: PROLACTIN No results found for: CHOL, TRIG, HDL, CHOLHDL, VLDL, LDLCALC Lab Results  Component  Value Date   TSH 1.009 Test methodology is 3rd generation TSH 07/31/2009    Therapeutic Level Labs: No results found for: LITHIUM No results found for: VALPROATE No components found for:  CBMZ  Current Medications: Current Outpatient Medications  Medication Sig Dispense Refill   albuterol (VENTOLIN HFA) 108 (90 Base) MCG/ACT inhaler Inhale into the lungs.     amLODipine (NORVASC) 10 MG tablet      benztropine (COGENTIN) 1 MG tablet TAKE 1 TABLET BY MOUTH  DAILY AS NEEDED FOR TREMORS AND SIDE EFFECTS OF  RISPERIDONE 90 tablet 3   Calcium Carbonate-Vitamin D 600-400 MG-UNIT tablet Take 2  tablets by mouth daily.     fluticasone (FLONASE) 50 MCG/ACT nasal spray Place into the nose.     pantoprazole (PROTONIX) 20 MG tablet Take 1 tablet by mouth 2 (two) times daily before a meal.     pravastatin (PRAVACHOL) 40 MG tablet Take by mouth.     risperidone (RISPERDAL) 4 MG tablet TAKE 1 TABLET BY MOUTH AT  BEDTIME 90 tablet 3   No current facility-administered medications for this visit.     Musculoskeletal: Strength & Muscle Tone: within normal limits Gait & Station: normal Patient leans: N/A  Psychiatric Specialty Exam: Review of Systems  Gastrointestinal:  Positive for constipation.  All other systems reviewed and are negative.  Blood pressure 123/83, pulse 88, temperature 98.3 F (36.8 C), temperature source Temporal, height 5' 10.87" (1.8 m), weight 197 lb (89.4 kg).Body mass index is 27.58 kg/m.  General Appearance: Fairly Groomed  Eye Contact:  Fair  Speech:  Clear and Coherent  Volume:  Normal  Mood:  Euthymic  Affect:  Congruent  Thought Process:  Goal Directed and Descriptions of Associations: Intact  Orientation:  Full (Time, Place, and Person)  Thought Content: Hallucinations: Auditory chronic, command - does not distress, able to cope  Suicidal Thoughts:  No  Homicidal Thoughts:  No  Memory:  Immediate;   Fair Recent;   Fair Remote;   Fair  Judgement:  Fair  Insight:  Fair  Psychomotor Activity:  Normal  Concentration:  Concentration: Fair and Attention Span: Fair  Recall:  [employer] of Knowledge: Fair  Language: Fair  Akathisia:  No  Handed:  Right  AIMS (if indicated): done  Assets:  Communication Skills Desire for Improvement Housing Physical Health Resilience Social Support Talents/Skills  ADL's:  Intact  Cognition: WNL  Sleep:  Good   Screenings: PHQ2-9    Flowsheet Row [facility] Visit from [facility]/20/2020 in [facility]  PHQ-2 Total Score 0      Flowsheet Row Office Visit from 12/20/2020 in [facility]  C-SSRS RISK CATEGORY No Risk        Assessment and Plan: William Oconnor is a 45 year old African-American male who has a history of schizophrenia, hypertension, hyperlipidemia was evaluated in office today.  Patient is currently stable.  Plan Schizophrenia-stable Risperidone 4 mg p.o. nightly Cogentin 1 mg as needed for side effects of risperidone.  High risk medication use-will order prolactin level. He will go to [facility] lab.  Follow-up in clinic in 3 months or sooner if needed.  This note was generated in part or whole with voice recognition software. Voice recognition is usually quite accurate but there are transcription errors that can and very often do occur. I apologize for any typographical errors that were not detected and corrected.       Jomarie Longs, MD 12/20/2020, 11:48 AM

## 2020-12-21 LAB — PROLACTIN: Prolactin: 16.9 ng/mL — ABNORMAL HIGH (ref 4.0–15.2)

## 2021-03-21 ENCOUNTER — Other Ambulatory Visit: Payer: Self-pay

## 2021-03-21 ENCOUNTER — Telehealth (INDEPENDENT_AMBULATORY_CARE_PROVIDER_SITE_OTHER): Payer: Medicare Other | Admitting: Psychiatry

## 2021-03-21 ENCOUNTER — Encounter: Payer: Self-pay | Admitting: Psychiatry

## 2021-03-21 DIAGNOSIS — E221 Hyperprolactinemia: Secondary | ICD-10-CM | POA: Insufficient documentation

## 2021-03-21 DIAGNOSIS — F209 Schizophrenia, unspecified: Secondary | ICD-10-CM | POA: Diagnosis not present

## 2021-03-21 NOTE — Progress Notes (Signed)
Virtual Visit via Video Note  I connected with William Oconnor on 03/21/21 at 11:30 AM EDT by a video enabled telemedicine application and verified that I am speaking with the correct person using two identifiers.  Location Provider Location : [facility] Patient Location : Home  Participants: Patient , Provider    I discussed the limitations of evaluation and management by telemedicine and the availability of in person appointments. The patient expressed understanding and agreed to proceed.   I discussed the assessment and treatment plan with the patient. The patient was provided an opportunity to ask questions and all were answered. The patient agreed with the plan and demonstrated an understanding of the instructions.   The patient was advised to call back or seek an in-person evaluation if the symptoms worsen or if the condition fails to improve as anticipated.    BH MD OP Progress Note  03/21/2021 4:26 PM William Oconnor  MRN:  [id number]  Chief Complaint:  Chief Complaint   Follow-up; Hallucinations    HPI: William Oconnor is a 45 year old African-American male, single, lives in [place], on disability, has a history of schizophrenia, hypertension, hyperlipidemia was evaluated by telemedicine today.  Patient currently denies any auditory hallucinations.  Patient reports mood is stable.  He enjoys taking walks, listens to music.  He continues to have good support system from his sisters.  Patient is compliant on medications.  Denies side effects.  Reports sleep and appetite is fair.  Denies any suicidality or homicidality.  Reviewed and discussed most recent prolactin level-slightly elevated.  Patient is currently asymptomatic.  Patient denies any other concerns today.  Visit Diagnosis:    ICD-10-CM   1. Schizophrenia, chronic condition (HCC)  F20.9     2. Hyperprolactinemia (HCC)  E22.1       Past Psychiatric History: Reviewed past psychiatric history from progress  note on 04/28/2020. Past trials of medications- Risperidone Cogentin  Past Medical History:  Past Medical History:  Diagnosis Date   Asthma    Hyperlipidemia    Hypertension    Schizophrenia (HCC)    History reviewed. No pertinent surgical history.  Family Psychiatric History: Reviewed family psychiatric history from progress note on 04/28/2020  Family History:  Family History  Problem Relation Age of Onset   Schizophrenia Mother     Social History: Reviewed social history from progress note on 04/28/2020 Social History   Socioeconomic History   Marital status: Single    Spouse name: Not on file   Number of children: Not on file   Years of education: 12 TH GRADE   Highest education level: Not on file  Occupational History   Not on file  Tobacco Use   Smoking status: Former    Types: Cigarettes, Cigars    Quit date: 04/28/2010    Years since quitting: 10.9   Smokeless tobacco: Never  Vaping Use   Vaping Use: Never used  Substance and Sexual Activity   Alcohol use: Not Currently   Drug use: Not Currently    Types: Marijuana   Sexual activity: Yes  Other Topics Concern   Not on file  Social History Narrative   Not on file   Social Determinants of Health   Financial Resource Strain: Not on file  Food Insecurity: Not on file  Transportation Needs: Not on file  Physical Activity: Not on file  Stress: Not on file  Social Connections: Not on file    Allergies:  Allergies  Allergen Reactions  Atorvastatin Other (See Comments)    Transaminitis     Metabolic Disorder Labs: No results found for: HGBA1C, MPG Lab Results  Component Value Date   PROLACTIN 16.9 (H) 12/20/2020   No results found for: CHOL, TRIG, HDL, CHOLHDL, VLDL, LDLCALC   Therapeutic Level Labs: No results found for: LITHIUM No results found for: VALPROATE No components found for:  CBMZ  Current Medications: Current Outpatient Medications  Medication Sig Dispense Refill    pravastatin (PRAVACHOL) 40 MG tablet Take 1 tablet by mouth at bedtime.     albuterol (VENTOLIN HFA) 108 (90 Base) MCG/ACT inhaler Inhale into the lungs.     amLODipine (NORVASC) 10 MG tablet      benztropine (COGENTIN) 1 MG tablet TAKE 1 TABLET BY MOUTH  DAILY AS NEEDED FOR TREMORS AND SIDE EFFECTS OF  RISPERIDONE 90 tablet 3   Calcium Carbonate-Vitamin D 600-400 MG-UNIT tablet Take 2 tablets by mouth daily.     fluticasone (FLONASE) 50 MCG/ACT nasal spray Place into the nose.     pantoprazole (PROTONIX) 20 MG tablet Take 1 tablet by mouth 2 (two) times daily before a meal.     pravastatin (PRAVACHOL) 40 MG tablet Take by mouth.     risperidone (RISPERDAL) 4 MG tablet TAKE 1 TABLET BY MOUTH AT  BEDTIME 90 tablet 3   No current facility-administered medications for this visit.     Musculoskeletal: Strength & Muscle Tone:  UTA Gait & Station:  Seated Patient leans: N/A  Psychiatric Specialty Exam: Review of Systems  Psychiatric/Behavioral:  Negative for agitation, behavioral problems, confusion, decreased concentration, dysphoric mood, hallucinations, self-injury, sleep disturbance and suicidal ideas. The patient is not nervous/anxious and is not hyperactive.   All other systems reviewed and are negative.  There were no vitals taken for this visit.There is no height or weight on file to calculate BMI.  General Appearance: Casual  Eye Contact:  Fair  Speech:  Normal Rate  Volume:  Normal  Mood:  Euthymic  Affect:  Congruent  Thought Process:  Goal Directed and Descriptions of Associations: Intact  Orientation:  Full (Time, Place, and Person)  Thought Content: Logical   Suicidal Thoughts:  No  Homicidal Thoughts:  No  Memory:  Immediate;   Fair Recent;   Fair Remote;   Fair  Judgement:  Fair  Insight:  Fair  Psychomotor Activity:  Normal  Concentration:  Concentration: Fair and Attention Span: Fair  Recall:  [employer] of Knowledge: Fair  Language: Fair  Akathisia:  No   Handed:  Right  AIMS (if indicated): done  Assets:  Communication Skills Desire for Improvement Housing Social Support  ADL's:  Intact  Cognition: WNL  Sleep:  Fair   Screenings: PHQ2-9    Flowsheet Row [facility] Visit [facility] 12/20/2020 in [facility]  PHQ-2 Total Score 0      Flowsheet Row Office Visit from 12/20/2020 in [facility]  C-SSRS RISK CATEGORY No Risk        Assessment and Plan: William Oconnor is a 45 year old African-American male who has a history of schizophrenia, hypertension, hyperlipidemia was evaluated by telemedicine today.  Patient is currently stable.  Plan Schizophrenia-stable Risperidone 4 mg p.o. nightly Cogentin 1 mg as needed for side effects of risperidone  Hyperprolactinemia-unstable Reviewed and discussed most recent prolactin level dated 12/20/2020-16.9-elevated Patient is currently asymptomatic. Likely due to risperidone therapy. We will monitor and repeat labs as needed  Follow-up in clinic in 4 months in  person.  This note was generated in part or whole with voice recognition software. Voice recognition is usually quite accurate but there are transcription errors that can and very often do occur. I apologize for any typographical errors that were not detected and corrected.       Jomarie Longs, MD 03/21/2021, 4:26 PM

## 2021-05-19 ENCOUNTER — Encounter
Admission: RE | Disposition: A | Discharge status: Home / Self Care | Payer: Self-pay | Source: Ambulatory Visit | Attending: Gastroenterology

## 2021-05-19 ENCOUNTER — Ambulatory Visit: Payer: Medicare Other | Admitting: Certified Registered Nurse Anesthetist

## 2021-05-19 ENCOUNTER — Other Ambulatory Visit: Payer: Self-pay

## 2021-05-19 ENCOUNTER — Ambulatory Visit
Admission: RE | Admit: 2021-05-19 | Discharge: 2021-05-19 | Disposition: A | Discharge status: Home / Self Care | Payer: Medicare Other | Source: Ambulatory Visit | Attending: Gastroenterology | Admitting: Gastroenterology

## 2021-05-19 DIAGNOSIS — I10 Essential (primary) hypertension: Secondary | ICD-10-CM | POA: Diagnosis not present

## 2021-05-19 DIAGNOSIS — Z87891 Personal history of nicotine dependence: Secondary | ICD-10-CM | POA: Diagnosis not present

## 2021-05-19 DIAGNOSIS — K6289 Other specified diseases of anus and rectum: Secondary | ICD-10-CM | POA: Diagnosis not present

## 2021-05-19 DIAGNOSIS — Z79899 Other long term (current) drug therapy: Secondary | ICD-10-CM | POA: Diagnosis not present

## 2021-05-19 DIAGNOSIS — E785 Hyperlipidemia, unspecified: Secondary | ICD-10-CM | POA: Diagnosis not present

## 2021-05-19 DIAGNOSIS — K640 First degree hemorrhoids: Secondary | ICD-10-CM | POA: Insufficient documentation

## 2021-05-19 DIAGNOSIS — Z1211 Encounter for screening for malignant neoplasm of colon: Secondary | ICD-10-CM | POA: Insufficient documentation

## 2021-05-19 HISTORY — DX: Elevated prostate specific antigen (PSA): R97.20

## 2021-05-19 HISTORY — PX: COLONOSCOPY WITH PROPOFOL: SHX5780

## 2021-05-19 SURGERY — COLONOSCOPY WITH PROPOFOL
Anesthesia: General

## 2021-05-19 MED ORDER — SODIUM CHLORIDE 0.9 % IV SOLN: INTRAVENOUS | Status: DC

## 2021-05-19 MED ORDER — PROPOFOL 10 MG/ML IV BOLUS
INTRAVENOUS | Status: DC | PRN
  Administered 2021-05-19: 60 mg via INTRAVENOUS
  Administered 2021-05-19: 20 mg via INTRAVENOUS

## 2021-05-19 MED ORDER — LIDOCAINE HCL (PF) 2 % IJ SOLN
INTRAMUSCULAR | Status: AC
  Filled 2021-05-19: qty 5

## 2021-05-19 MED ORDER — DEXMEDETOMIDINE (PRECEDEX) IN NS 20 MCG/5ML (4 MCG/ML) IV SYRINGE
PREFILLED_SYRINGE | INTRAVENOUS | Status: DC | PRN
  Administered 2021-05-19: 4 ug via INTRAVENOUS

## 2021-05-19 MED ORDER — PROPOFOL 500 MG/50ML IV EMUL
INTRAVENOUS | Status: AC
  Filled 2021-05-19: qty 50

## 2021-05-19 MED ORDER — PROPOFOL 500 MG/50ML IV EMUL
INTRAVENOUS | Status: DC | PRN
Start: 2021-05-19 — End: 2021-05-19
  Administered 2021-05-19: 150 ug/kg/min via INTRAVENOUS

## 2021-05-19 NOTE — Anesthesia Postprocedure Evaluation (Signed)
Anesthesia Post Note  Patient: William Oconnor  Procedure(s) Performed: COLONOSCOPY WITH PROPOFOL  Patient location during evaluation: PACU Anesthesia Type: General Level of consciousness: awake Vital Signs Assessment: post-procedure vital signs reviewed and stable Respiratory status: spontaneous breathing and respiratory function stable Cardiovascular status: blood pressure returned to baseline Anesthetic complications: no   No notable events documented.   Last Vitals:  Vitals:   05/19/21 0833 05/19/21 0840  BP: 117/74 122/90  Pulse:    Resp:    Temp:    SpO2:      Last Pain:  Vitals:   05/19/21 0840  TempSrc:   PainSc: 0-No pain                 VAN STAVEREN,Darrill Vreeland

## 2021-05-19 NOTE — Op Note (Signed)
[facility] Gastroenterology Patient Name: William Oconnor Procedure Date: 05/19/2021 7:44 AM MRN: [id number] Account #: [id number] Date of Birth: 08-Sep-1975 Admit Type: Outpatient Age: 45 Room: [facility] ENDO ROOM 3 Gender: Male Note Status: Finalized Instrument Name: Colonoscope [id number] Procedure:             Colonoscopy Indications:           Screening for colorectal malignant neoplasm Providers:             Andrey Farmer MD, MD Referring MD:          Dion Body (Referring MD) Medicines:             Monitored Anesthesia Care Complications:         No immediate complications. Procedure:             Pre-Anesthesia Assessment:                        - Prior to the procedure, a History and Physical was                         performed, and patient medications and allergies were                         reviewed. The patient is competent. The risks and                         benefits of the procedure and the sedation options and                         risks were discussed with the patient. All questions                         were answered and informed consent was obtained.                         Patient identification and proposed procedure were                         verified by the physician, the nurse, the anesthetist                         and the technician in the endoscopy suite. Mental                         Status Examination: alert and oriented. Airway                         Examination: normal oropharyngeal airway and neck                         mobility. Respiratory Examination: clear to                         auscultation. CV Examination: normal. Prophylactic                         Antibiotics: The patient does not require prophylactic  antibiotics. Prior Anticoagulants: The patient has                         taken no previous anticoagulant or antiplatelet                         agents. ASA Grade Assessment: II - A  patient with mild                         systemic disease. After reviewing the risks and                         benefits, the patient was deemed in satisfactory                         condition to undergo the procedure. The anesthesia                         plan was to use monitored anesthesia care (MAC).                         Immediately prior to administration of medications,                         the patient was re-assessed for adequacy to receive                         sedatives. The heart rate, respiratory rate, oxygen                         saturations, blood pressure, adequacy of pulmonary                         ventilation, and response to care were monitored                         throughout the procedure. The physical status of the                         patient was re-assessed after the procedure.                        After obtaining informed consent, the colonoscope was                         passed under direct vision. Throughout the procedure,                         the patient's blood pressure, pulse, and oxygen                         saturations were monitored continuously. The                         Colonoscope was introduced through the anus and                         advanced to the the cecum, identified by appendiceal  orifice and ileocecal valve. The colonoscopy was                         performed without difficulty. The patient tolerated                         the procedure well. The quality of the bowel                         preparation was good. Findings:      The perianal and digital rectal examinations were normal.      Anal papilla(e) were hypertrophied. Biopsies were taken with a cold       forceps for histology. Estimated blood loss was minimal.      Internal hemorrhoids were found during retroflexion. The hemorrhoids       were Grade I (internal hemorrhoids that do not prolapse).      The exam was otherwise  without abnormality on direct and retroflexion       views. Impression:            - Anal papilla(e) were hypertrophied. Biopsied.                        - Internal hemorrhoids.                        - The examination was otherwise normal on direct and                         retroflexion views. Recommendation:        - Discharge patient to home.                        - Resume previous diet.                        - Continue present medications.                        - Await pathology results.                        - Repeat colonoscopy in 10 years for screening                         purposes.                        - Return to referring physician as previously                         scheduled. Procedure Code(s):     --- Professional ---                        [phone], Colonoscopy, flexible; with biopsy, single or                         multiple Diagnosis Code(s):     --- Professional ---                        Z12.11, Encounter for screening for malignant neoplasm  of colon                        K62.89, Other specified diseases of anus and rectum                        K64.0, First degree hemorrhoids CPT copyright 2019 American Medical Association. All rights reserved. The codes documented in this report are preliminary and upon coder review may  be revised to meet current compliance requirements. Andrey Farmer MD, MD 05/19/2021 8:13:26 AM Number of Addenda: 0 Note Initiated On: 05/19/2021 7:44 AM Scope Withdrawal Time: 0 hours 13 minutes 36 seconds  Total Procedure Duration: 0 hours 18 minutes 22 seconds  Estimated Blood Loss:  Estimated blood loss: none.      [facility]

## 2021-05-19 NOTE — Anesthesia Preprocedure Evaluation (Signed)
Anesthesia Evaluation  Patient identified by MRN, date of birth, ID band Patient awake    Reviewed: Allergy & Precautions, NPO status , Patient's Chart, lab work & pertinent test results  Airway Mallampati: II  TM Distance: >3 FB Neck ROM: full    Dental  (+) Upper Dentures, Lower Dentures   Pulmonary neg pulmonary ROS, asthma , former smoker,    Pulmonary exam normal breath sounds clear to auscultation       Cardiovascular Exercise Tolerance: Good hypertension, Pt. on medications negative cardio ROS Normal cardiovascular exam Rhythm:Regular     Neuro/Psych negative neurological ROS  negative psych ROS   GI/Hepatic negative GI ROS, Neg liver ROS,   Endo/Other  negative endocrine ROS  Renal/GU negative Renal ROS  negative genitourinary   Musculoskeletal   Abdominal Normal abdominal exam  (+)   Peds negative pediatric ROS (+)  Hematology negative hematology ROS (+)   Anesthesia Other Findings Past Medical History: No date: Asthma No date: Elevated PSA No date: Hyperlipidemia No date: Hypertension No date: Schizophrenia [facility])  History reviewed. No pertinent surgical history.  BMI    Body Mass Index: 29.53 kg/m      Reproductive/Obstetrics negative OB ROS                             Anesthesia Physical Anesthesia Plan  ASA: 2  Anesthesia Plan: General   Post-op Pain Management:    Induction: Intravenous  PONV Risk Score and Plan: Propofol infusion and TIVA  Airway Management Planned: Nasal Cannula  Additional Equipment:   Intra-op Plan:   Post-operative Plan:   Informed Consent: I have reviewed the patients History and Physical, chart, labs and discussed the procedure including the risks, benefits and alternatives for the proposed anesthesia with the patient or authorized representative who has indicated his/her understanding and acceptance.     Dental Advisory  Given  Plan Discussed with: CRNA and Surgeon  Anesthesia Plan Comments:         Anesthesia Quick Evaluation

## 2021-05-19 NOTE — Interval H&P Note (Signed)
History and Physical Interval Note:  05/19/2021 7:42 AM  William Oconnor  has presented today for surgery, with the diagnosis of colon screening.  The various methods of treatment have been discussed with the patient and family. After consideration of risks, benefits and other options for treatment, the patient has consented to  Procedure(s): COLONOSCOPY WITH PROPOFOL (N/A) as a surgical intervention.  The patient's history has been reviewed, patient examined, no change in status, stable for surgery.  I have reviewed the patient's chart and labs.  Questions were answered to the patient's satisfaction.     Regis Bill  Ok to proceed with colonoscopy

## 2021-05-19 NOTE — Transfer of Care (Signed)
Immediate Anesthesia Transfer of Care Note  Patient: William Oconnor  Procedure(s) Performed: COLONOSCOPY WITH PROPOFOL  Patient Location: Endoscopy Unit  Anesthesia Type:General  Level of Consciousness: drowsy  Airway & Oxygen Therapy: Patient Spontanous Breathing and Patient connected to nasal cannula oxygen  Post-op Assessment: Report given to RN and Post -op Vital signs reviewed and stable  Post vital signs: Reviewed and stable  Last Vitals:  Vitals Value Taken Time  BP 97/72 05/19/21 0814  Temp    Pulse 81 05/19/21 0815  Resp 14 05/19/21 0815  SpO2 95 % 05/19/21 0815  Vitals shown include unvalidated device data.  Last Pain:  Vitals:   05/19/21 0814  TempSrc:   PainSc: 0-No pain         Complications: No notable events documented.

## 2021-05-19 NOTE — H&P (Signed)
Outpatient short stay form Pre-procedure 05/19/2021  Regis Bill, MD  Primary Physician: Marisue Ivan, MD  Reason for visit:  Screening colonoscopy  History of present illness:   45 y/o gentleman with history of hypertension and possible schizophrenia here for screening colonoscopy. No blood thinner. No abdominal surgeries. No known family history of GI malignancies.    Current Facility-Administered Medications:    0.9 %  sodium chloride infusion, , Intravenous, Continuous, Shade Kaley, Rossie Muskrat, MD, Last Rate: 20 mL/hr at 05/19/21 0705, New Bag at 05/19/21 0705  Medications Prior to Admission  Medication Sig Dispense Refill Last Dose   amLODipine (NORVASC) 10 MG tablet    05/19/2021 at 0545   benztropine (COGENTIN) 1 MG tablet TAKE 1 TABLET BY MOUTH  DAILY AS NEEDED FOR TREMORS AND SIDE EFFECTS OF  RISPERIDONE 90 tablet 3 05/18/2021 at 2200   Calcium Carbonate-Vitamin D 600-400 MG-UNIT tablet Take 2 tablets by mouth daily.   05/18/2021 at 00800   fluticasone (FLONASE) 50 MCG/ACT nasal spray Place into the nose.   05/18/2021 at 0800   pantoprazole (PROTONIX) 20 MG tablet Take 1 tablet by mouth 2 (two) times daily before a meal.   05/18/2021 at 0800   pravastatin (PRAVACHOL) 40 MG tablet Take 1 tablet by mouth at bedtime.   05/18/2021 at 2200   risperidone (RISPERDAL) 4 MG tablet TAKE 1 TABLET BY MOUTH AT  BEDTIME 90 tablet 3 05/18/2021 at 2200   albuterol (VENTOLIN HFA) 108 (90 Base) MCG/ACT inhaler Inhale into the lungs.      pravastatin (PRAVACHOL) 40 MG tablet Take by mouth.        Allergies  Allergen Reactions   Atorvastatin Other (See Comments)    Transaminitis      Past Medical History:  Diagnosis Date   Asthma    Elevated PSA    Hyperlipidemia    Hypertension    Schizophrenia (HCC)     Review of systems:  Otherwise negative.    Physical Exam  Gen: Alert, oriented. Appears stated age.  HEENT: PERRLA. Lungs: No respiratory distress CV: RRR Abd:  soft, benign, no masses Ext: No edema    Planned procedures: Proceed with colonoscopy. The patient understands the nature of the planned procedure, indications, risks, alternatives and potential complications including but not limited to bleeding, infection, perforation, damage to internal organs and possible oversedation/side effects from anesthesia. The patient agrees and gives consent to proceed.  Please refer to procedure notes for findings, recommendations and patient disposition/instructions.     Regis Bill, MD [facility] Gastroenterology

## 2021-05-22 ENCOUNTER — Encounter: Payer: Self-pay | Admitting: Gastroenterology

## 2021-05-22 LAB — SURGICAL PATHOLOGY

## 2021-07-18 ENCOUNTER — Other Ambulatory Visit: Payer: Self-pay

## 2021-07-18 ENCOUNTER — Ambulatory Visit: Payer: Medicare Other | Admitting: Psychiatry

## 2021-07-18 ENCOUNTER — Encounter: Payer: Self-pay | Admitting: Psychiatry

## 2021-07-18 ENCOUNTER — Telehealth (INDEPENDENT_AMBULATORY_CARE_PROVIDER_SITE_OTHER): Payer: 59 | Admitting: Psychiatry

## 2021-07-18 DIAGNOSIS — F209 Schizophrenia, unspecified: Secondary | ICD-10-CM | POA: Diagnosis not present

## 2021-07-18 NOTE — Progress Notes (Signed)
Virtual Visit via Video Note  I connected with William Oconnor on 07/18/21 at 10:00 AM EST by a video enabled telemedicine application and verified that I am speaking with the correct person using two identifiers.  Location Provider Location : [facility] Patient Location : Home  Participants: Patient , Provider    I discussed the limitations of evaluation and management by telemedicine and the availability of in person appointments. The patient expressed understanding and agreed to proceed.   I discussed the assessment and treatment plan with the patient. The patient was provided an opportunity to ask questions and all were answered. The patient agreed with the plan and demonstrated an understanding of the instructions.   The patient was advised to call back or seek an in-person evaluation if the symptoms worsen or if the condition fails to improve as anticipated.   [facility] MD OP Progress Note  07/18/2021 10:12 AM William Oconnor  MRN:  [id number]  Chief Complaint:  Chief Complaint   Follow-up; Depression; Hallucinations    HPI: William Oconnor is a 46 year old African-American male, single, lives in [place], on disability, has a history of schizophrenia, hypertension, hyperlipidemia was evaluated by telemedicine today.  Patient today reports overall he is currently stable on the current medication regimen.  He is compliant on the risperidone.  Denies any side effects.  Does report intrusive negative thoughts at times however he has been coping okay.  He is able to talk to his family as well as use relaxation techniques which helps.  Reports he had a good holiday season with his family.  Patient reports appetite as good.  Denies any sleep problems.  Denies any suicidality, homicidality.  Did not seem preoccupied with any delusions or paranoia.  Denies any perceptual disturbances.  Patient denies any other concerns today.  Visit Diagnosis:    ICD-10-CM   1. Schizophrenia, chronic  condition (HCC)  F20.9       Past Psychiatric History: Reviewed past psychiatric history from progress note on 04/28/2020.  Past trials of medications risperidone, Cogentin.  Past Medical History:  Past Medical History:  Diagnosis Date   Asthma    Elevated PSA    Hyperlipidemia    Hypertension    Schizophrenia (HCC)     Past Surgical History:  Procedure Laterality Date   COLONOSCOPY WITH PROPOFOL N/A 05/19/2021   Procedure: COLONOSCOPY WITH PROPOFOL;  Surgeon: Regis Bill, MD;  Location: [facility] ENDOSCOPY;  Service: Endoscopy;  Laterality: N/A;    Family Psychiatric History: Reviewed family psychiatric history from progress note on 04/28/2020.  Family History:  Family History  Problem Relation Age of Onset   Schizophrenia Mother     Social History: Reviewed social history from progress note on 04/28/2020. Social History   Socioeconomic History   Marital status: Single    Spouse name: Not on file   Number of children: Not on file   Years of education: 22 TH GRADE   Highest education level: Not on file  Occupational History   Not on file  Tobacco Use   Smoking status: Former    Types: Cigarettes, Cigars    Quit date: 04/28/2010    Years since quitting: 11.2   Smokeless tobacco: Never  Vaping Use   Vaping Use: Never used  Substance and Sexual Activity   Alcohol use: Not Currently   Drug use: Not Currently    Types: Marijuana   Sexual activity: Yes  Other Topics Concern   Not on file  Social History  Narrative   Not on file   Social Determinants of Health   Financial Resource Strain: Not on file  Food Insecurity: Not on file  Transportation Needs: Not on file  Physical Activity: Not on file  Stress: Not on file  Social Connections: Not on file    Allergies:  Allergies  Allergen Reactions   Atorvastatin Other (See Comments)    Transaminitis     Metabolic Disorder Labs: No results found for: HGBA1C, MPG Lab Results  Component Value Date    PROLACTIN 16.9 (H) 12/20/2020   No results found for: CHOL, TRIG, HDL, CHOLHDL, VLDL, LDLCALC Lab Results  Component Value Date   TSH 1.009 Test methodology is 3rd generation TSH 07/31/2009    Therapeutic Level Labs: No results found for: LITHIUM No results found for: VALPROATE No components found for:  CBMZ  Current Medications: Current Outpatient Medications  Medication Sig Dispense Refill   albuterol (VENTOLIN HFA) 108 (90 Base) MCG/ACT inhaler Inhale into the lungs.     amLODipine (NORVASC) 10 MG tablet      benztropine (COGENTIN) 1 MG tablet TAKE 1 TABLET BY MOUTH  DAILY AS NEEDED FOR TREMORS AND SIDE EFFECTS OF  RISPERIDONE 90 tablet 3   Calcium Carbonate-Vitamin D 600-400 MG-UNIT tablet Take 2 tablets by mouth daily.     fluticasone (FLONASE) 50 MCG/ACT nasal spray Place into the nose.     pantoprazole (PROTONIX) 20 MG tablet Take 1 tablet by mouth 2 (two) times daily before a meal.     pravastatin (PRAVACHOL) 40 MG tablet Take by mouth.     pravastatin (PRAVACHOL) 40 MG tablet Take 1 tablet by mouth at bedtime.     risperidone (RISPERDAL) 4 MG tablet TAKE 1 TABLET BY MOUTH AT  BEDTIME 90 tablet 3   No current facility-administered medications for this visit.     Musculoskeletal: Strength & Muscle Tone:  UTA Gait & Station:  Seated Patient leans: N/A  Psychiatric Specialty Exam: Review of Systems  Psychiatric/Behavioral:  Negative for agitation, behavioral problems, confusion, decreased concentration, dysphoric mood, hallucinations, self-injury, sleep disturbance and suicidal ideas. The patient is not nervous/anxious and is not hyperactive.   All other systems reviewed and are negative.  There were no vitals taken for this visit.There is no height or weight on file to calculate BMI.  General Appearance: Casual  Eye Contact:  Fair  Speech:  Normal Rate  Volume:  Normal  Mood:  Euthymic  Affect:  Congruent  Thought Process:  Goal Directed and Descriptions of  Associations: Intact  Orientation:  Full (Time, Place, and Person)  Thought Content: Logical   Suicidal Thoughts:  No  Homicidal Thoughts:  No  Memory:  Immediate;   Fair Recent;   Fair Remote;   Fair  Judgement:  Fair  Insight:  Fair  Psychomotor Activity:  Normal  Concentration:  Concentration: Fair and Attention Span: Fair  Recall:  [employer]Fair  Fund of Knowledge: Fair  Language: Fair  Akathisia:  No  Handed:  Right  AIMS (if indicated): done,0  Assets:  Communication Skills Desire for Improvement Housing Social Support  ADL's:  Intact  Cognition: WNL  Sleep:  Fair   Screenings: PHQ2-9    Flowsheet Row [facility]  PHQ-2 Total Score 0      Flowsheet Row [facility]Dis[facility]) [facility]  C-SSRS RISK CATEGORY No Risk No Risk  Assessment and Plan: William Oconnor is a 46 year old African-American male who has a history of schizophrenia, hypertension, hyperlipidemia was evaluated by telemedicine today.  Patient is currently stable.  Plan as noted below.  Plan Schizophrenia-stable Risperidone 4 mg p.o. nightly. Continue Cogentin 1 mg as needed for side effects of risperidone. AIMS - 0  Follow-up in clinic in 3 to 4 months or sooner in person.  This note was generated in part or whole with voice recognition software. Voice recognition is usually quite accurate but there are transcription errors that can and very often do occur. I apologize for any typographical errors that were not detected and corrected.       Jomarie Longs, MD 07/18/2021, 10:12 AM

## 2021-09-18 ENCOUNTER — Other Ambulatory Visit: Payer: Self-pay | Admitting: Psychiatry

## 2021-09-18 DIAGNOSIS — F209 Schizophrenia, unspecified: Secondary | ICD-10-CM

## 2021-10-24 ENCOUNTER — Ambulatory Visit (INDEPENDENT_AMBULATORY_CARE_PROVIDER_SITE_OTHER): Payer: 59 | Admitting: Psychiatry

## 2021-10-24 ENCOUNTER — Encounter: Payer: Self-pay | Admitting: Psychiatry

## 2021-10-24 VITALS — BP 134/85 | HR 91 | Temp 97.9°F | Wt 211.6 lb

## 2021-10-24 DIAGNOSIS — F209 Schizophrenia, unspecified: Secondary | ICD-10-CM

## 2021-10-24 MED ORDER — BENZTROPINE MESYLATE 1 MG PO TABS: ORAL_TABLET | ORAL | 3 refills | Status: DC

## 2021-10-24 NOTE — Progress Notes (Signed)
BH MD OP Progress Note ? ?10/24/2021 11:38 AM ?William RoeLarry E Pawloski  ?MRN:  [id number] ? ?Chief Complaint:  ?Chief Complaint  ?Patient presents with  ? Follow-up : 46 year old African-American male, single, lives in [place], has a history of schizophrenia, presented for medication management.  ? ?HPI: William RoeLarry E Oconnor is a 46 year old African-American male, single, lives in [place] on disability, has a history of schizophrenia, hypertension, hyperlipidemia was evaluated in office today. ? ?Patient today reports he is currently doing well with regards to his mood.  Denies any depression or anxiety symptoms. ? ?Reports appetite is good.  He would like to make healthy choices with his food and has been trying to. ? ?Patient reports sleep as good. ? ?Denies suicidality, homicidality or perceptual disturbances. ? ?Currently compliant with medications.  Denies side effects. ? ?Visit Diagnosis:  ?  ICD-10-CM   ?1. Schizophrenia, chronic condition (HCC)  F20.9 benztropine (COGENTIN) 1 MG tablet  ?  ? ? ?Past Psychiatric History: Reviewed past psychiatric history from progress note on 04/28/2020.  Past trials of medications, risperidone, Cogentin. ? ?Past Medical History:  ?Past Medical History:  ?Diagnosis Date  ? Asthma   ? Elevated PSA   ? Hyperlipidemia   ? Hypertension   ? Schizophrenia (HCC)   ?  ?Past Surgical History:  ?Procedure Laterality Date  ? COLONOSCOPY WITH PROPOFOL N/A 05/19/2021  ? Procedure: COLONOSCOPY WITH PROPOFOL;  Surgeon: Regis BillLocklear, Cameron T, MD;  Location: [facility] ENDOSCOPY;  Service: Endoscopy;  Laterality: N/A;  ? ? ?Family Psychiatric History: Reviewed family psychiatric history from progress note on 04/28/2020. ? ?Family History:  ?Family History  ?Problem Relation Age of Onset  ? Schizophrenia Mother   ? ? ?Social History: Reviewed social history from progress note on 04/28/2020. ?Social History  ? ?Socioeconomic History  ? Marital status: Single  ?  Spouse name: Not on file  ? Number of children: Not on file   ? Years of education: 4312 TH GRADE  ? Highest education level: Not on file  ?Occupational History  ? Not on file  ?Tobacco Use  ? Smoking status: Former  ?  Types: Cigarettes, Cigars  ?  Quit date: 04/28/2010  ?  Years since quitting: 11.5  ? Smokeless tobacco: Never  ?Vaping Use  ? Vaping Use: Never used  ?Substance and Sexual Activity  ? Alcohol use: Not Currently  ? Drug use: Not Currently  ?  Types: Marijuana  ? Sexual activity: Yes  ?Other Topics Concern  ? Not on file  ?Social History Narrative  ? Not on file  ? ?Social Determinants of Health  ? ?Financial Resource Strain: Not on file  ?Food Insecurity: Not on file  ?Transportation Needs: Not on file  ?Physical Activity: Not on file  ?Stress: Not on file  ?Social Connections: Not on file  ? ? ?Allergies:  ?Allergies  ?Allergen Reactions  ? Atorvastatin Other (See Comments)  ?  Transaminitis   ? ? ?Metabolic Disorder Labs: ?No results found for: HGBA1C, MPG ?Lab Results  ?Component Value Date  ? PROLACTIN 16.9 (H) 12/20/2020  ? ?No results found for: CHOL, TRIG, HDL, CHOLHDL, VLDL, LDLCALC ?Lab Results  ?Component Value Date  ? TSH 1.009 Test methodology is 3rd generation TSH 07/31/2009  ? ? ?Therapeutic Level Labs: ?No results found for: LITHIUM ?No results found for: VALPROATE ?No components found for:  CBMZ ? ?Current Medications: ?Current Outpatient Medications  ?Medication Sig Dispense Refill  ? amLODipine (NORVASC) 10 MG tablet Take 1 tablet by mouth  daily.    ? Calcium Carbonate-Vitamin D 600-400 MG-UNIT tablet Take 2 tablets by mouth daily.    ? fluticasone (FLONASE) 50 MCG/ACT nasal spray Place into the nose.    ? fluticasone-salmeterol (ADVAIR) 100-50 MCG/ACT AEPB Inhale into the lungs.    ? pantoprazole (PROTONIX) 20 MG tablet Take 1 tablet by mouth 2 (two) times daily before a meal.    ? polyethylene glycol-electrolytes (NULYTELY) 420 g solution Take 4,000 mLs by mouth as directed.    ? pravastatin (PRAVACHOL) 40 MG tablet Take 1 tablet by mouth at  bedtime.    ? risperidone (RISPERDAL) 4 MG tablet TAKE 1 TABLET BY MOUTH AT  BEDTIME 90 tablet 3  ? WIXELA INHUB 100-50 MCG/ACT AEPB Inhale into the lungs.    ? albuterol (VENTOLIN HFA) 108 (90 Base) MCG/ACT inhaler Inhale into the lungs.    ? benztropine (COGENTIN) 1 MG tablet TAKE 1 TABLET BY MOUTH  DAILY AS NEEDED FOR TREMORS AND SIDE EFFECTS OF  RISPERIDONE 90 tablet 3  ? doxycycline (VIBRA-TABS) 100 MG tablet Take 100 mg by mouth 2 (two) times daily. (Patient not taking: Reported on 10/24/2021)    ? ?No current facility-administered medications for this visit.  ? ? ? ?Musculoskeletal: ?Strength & Muscle Tone: within normal limits ?Gait & Station: normal ?Patient leans: N/A ? ?Psychiatric Specialty Exam: ?Review of Systems  ?Psychiatric/Behavioral:  Negative for agitation, behavioral problems, confusion, decreased concentration, dysphoric mood, hallucinations, self-injury, sleep disturbance and suicidal ideas. The patient is not nervous/anxious and is not hyperactive.   ?All other systems reviewed and are negative.  ?Blood pressure 134/85, pulse 91, temperature 97.9 ?F (36.6 ?C), temperature source Temporal, weight 211 lb 9.6 oz (96 kg).Body mass index is 31.25 kg/m?.  ?General Appearance: Casual  ?Eye Contact:  Fair  ?Speech:  Clear and Coherent  ?Volume:  Normal  ?Mood:  Euthymic  ?Affect:  Congruent  ?Thought Process:  Goal Directed and Descriptions of Associations: Intact  ?Orientation:  Full (Time, Place, and Person)  ?Thought Content: Logical   ?Suicidal Thoughts:  No  ?Homicidal Thoughts:  No  ?Memory:  Immediate;   Fair ?Recent;   Fair ?Remote;   Fair  ?Judgement:  Fair  ?Insight:  Fair  ?Psychomotor Activity:  Normal  ?Concentration:  Concentration: Fair and Attention Span: Fair  ?Recall:  Fair  ?Fund of Knowledge: Fair  ?Language: Fair  ?Akathisia:  No  ?Handed:  Right  ?AIMS (if indicated): done  ?Assets:  Communication Skills ?Desire for Improvement ?Housing ?Social Support  ?ADL's:  Intact   ?Cognition: WNL  ?Sleep:  Fair  ? ?Screenings: ?AIMS   ? ?Flowsheet Row Office Visit from 10/24/2021 in [facility]  ?AIMS Total Score 0  ? ?  ? ?GAD-7   ? ?Flowsheet Row Video Visit from 07/18/2021 in [facility]  ?Total GAD-7 Score 0  ? ?  ? ?PHQ2-9   ? ?Flowsheet Row Office Visit from 10/24/2021 in [facility] Video Visit from 07/18/2021 in [facility] Office Visit from 12/20/2020 in [facility]  ?PHQ-2 Total Score 0 0 0  ? ?  ? ?Flowsheet Row Admission (Discharged) from 05/19/2021 in [facility] Office Visit from 12/20/2020 in [facility]  ?C-SSRS RISK CATEGORY No Risk No Risk  ? ?  ? ? ? ?Assessment and Plan: CLEMMIE MARXEN is a 46 year old African-American male who has a history of schizophrenia, hypertension, hyperlipidemia was evaluated in  office today.  Patient is currently stable. ? ?Plan ?Schizophrenia-stable ?Risperidone 4 mg p.o. nightly ?Cogentin 1 mg as needed for side effects of risperidone.  He uses it only 4-5 times a month. ?AIMS - 0 ? ?Follow-up in clinic in 3 to 4 months or sooner if needed. ? ? ? ?This note was generated in part or whole with voice recognition software. Voice recognition is usually quite accurate but there are transcription errors that can and very often do occur. I apologize for any typographical errors that were not detected and corrected. ? ? ? ?Jomarie Longs, MD ?10/26/2021, 7:52 AM ? ?

## 2022-01-23 ENCOUNTER — Encounter: Payer: Self-pay | Admitting: Psychiatry

## 2022-01-23 ENCOUNTER — Ambulatory Visit (INDEPENDENT_AMBULATORY_CARE_PROVIDER_SITE_OTHER): Payer: 59 | Admitting: Psychiatry

## 2022-01-23 VITALS — BP 128/80 | HR 94 | Temp 97.9°F | Wt 207.8 lb

## 2022-01-23 DIAGNOSIS — F209 Schizophrenia, unspecified: Secondary | ICD-10-CM | POA: Diagnosis not present

## 2022-01-23 NOTE — Progress Notes (Signed)
BH MD OP Progress Note  01/23/2022 1:26 PM William Oconnor  MRN:  [id number]  Chief Complaint:  Chief Complaint  Patient presents with   Follow-up: 46 year old African-American male, single, lives in [place], has a history of schizophrenia, presented for medication management.   HPI: William Oconnor is a 46 year old African-American male, single, lives in [place], on disability, has a history of schizophrenia, hypertension, hyperlipidemia was evaluated in office today.  Patient today reports he is currently doing well with regards to his depression and anxiety.  Denies any significant mood symptoms, irritability or anger issues.  Reports sleep as good.  Reports appetite is fair.  Patient reports he is currently compliant on the risperidone as well as the benztropine as needed.  Denies side effects.  Reports he does have good support system from his family, his sisters.  He contacts them if he has any anxiety or other concerns.  Patient denies any other concerns at this time.  Visit Diagnosis:    ICD-10-CM   1. Schizophrenia, chronic condition (HCC)  F20.9       Past Psychiatric History: Reviewed past psychiatric history from progress note on 04/28/2020.  Past trials of medications, risperidone, Cogentin.  Past Medical History:  Past Medical History:  Diagnosis Date   Asthma    Elevated PSA    Hyperlipidemia    Hypertension    Schizophrenia (HCC)     Past Surgical History:  Procedure Laterality Date   COLONOSCOPY WITH PROPOFOL N/A 05/19/2021   Procedure: COLONOSCOPY WITH PROPOFOL;  Surgeon: Regis Bill, MD;  Location: [facility] ENDOSCOPY;  Service: Endoscopy;  Laterality: N/A;    Family Psychiatric History: Reviewed family psychiatric history from progress note on 04/28/2020.  Family History:  Family History  Problem Relation Age of Onset   Schizophrenia Mother     Social History: Reviewed social history from progress note on 04/28/2020. Social History    Socioeconomic History   Marital status: Single    Spouse name: Not on file   Number of children: Not on file   Years of education: 21 TH GRADE   Highest education level: Not on file  Occupational History   Not on file  Tobacco Use   Smoking status: Former    Types: Cigarettes, Cigars    Quit date: 04/28/2010    Years since quitting: 11.7   Smokeless tobacco: Never  Vaping Use   Vaping Use: Never used  Substance and Sexual Activity   Alcohol use: Not Currently   Drug use: Not Currently    Types: Marijuana   Sexual activity: Yes  Other Topics Concern   Not on file  Social History Narrative   Not on file   Social Determinants of Health   Financial Resource Strain: Not on file  Food Insecurity: Not on file  Transportation Needs: Not on file  Physical Activity: Not on file  Stress: Not on file  Social Connections: Not on file    Allergies:  Allergies  Allergen Reactions   Atorvastatin Other (See Comments)    Transaminitis     Metabolic Disorder Labs: No results found for: "HGBA1C", "MPG" Lab Results  Component Value Date   PROLACTIN 16.9 (H) 12/20/2020   No results found for: "CHOL", "TRIG", "HDL", "CHOLHDL", "VLDL", "LDLCALC" Lab Results  Component Value Date   TSH 1.009 Test methodology is 3rd generation TSH 07/31/2009    Therapeutic Level Labs: No results found for: "LITHIUM" No results found for: "VALPROATE" No results found for: "CBMZ"  Current  Medications: Current Outpatient Medications  Medication Sig Dispense Refill   amLODipine (NORVASC) 10 MG tablet Take 1 tablet by mouth daily.     benztropine (COGENTIN) 1 MG tablet TAKE 1 TABLET BY MOUTH  DAILY AS NEEDED FOR TREMORS AND SIDE EFFECTS OF  RISPERIDONE 90 tablet 3   Calcium Carbonate-Vitamin D 600-400 MG-UNIT tablet Take 2 tablets by mouth daily.     fluticasone (FLONASE) 50 MCG/ACT nasal spray Place into the nose.     pantoprazole (PROTONIX) 20 MG tablet Take 1 tablet by mouth 2 (two) times  daily before a meal.     polyethylene glycol-electrolytes (NULYTELY) 420 g solution Take 4,000 mLs by mouth as directed.     pravastatin (PRAVACHOL) 40 MG tablet Take 1 tablet by mouth at bedtime.     risperidone (RISPERDAL) 4 MG tablet TAKE 1 TABLET BY MOUTH AT  BEDTIME 90 tablet 3   WIXELA INHUB 100-50 MCG/ACT AEPB Inhale into the lungs.     albuterol (VENTOLIN HFA) 108 (90 Base) MCG/ACT inhaler Inhale into the lungs.     doxycycline (VIBRA-TABS) 100 MG tablet Take 100 mg by mouth 2 (two) times daily. (Patient not taking: Reported on 01/23/2022)     fluticasone-salmeterol (ADVAIR) 100-50 MCG/ACT AEPB Inhale into the lungs. (Patient not taking: Reported on 01/23/2022)     No current facility-administered medications for this visit.     Musculoskeletal: Strength & Muscle Tone: within normal limits Gait & Station: normal Patient leans:  NA  Psychiatric Specialty Exam: Review of Systems  Psychiatric/Behavioral: Negative.    All other systems reviewed and are negative.   Blood pressure 128/80, pulse 94, temperature 97.9 F (36.6 C), temperature source Temporal, weight 207 lb 12.8 oz (94.3 kg).Body mass index is 30.69 kg/m.  General Appearance: Casual  Eye Contact:  Minimal  Speech:  Clear and Coherent  Volume:  Normal  Mood:  Euthymic  Affect:  Congruent  Thought Process:  Goal Directed and Descriptions of Associations: Intact  Orientation:  Full (Time, Place, and Person)  Thought Content: Logical   Suicidal Thoughts:  No  Homicidal Thoughts:  No  Memory:  Immediate;   Fair Recent;   Fair Remote;   Fair  Judgement:  Fair  Insight:  Fair  Psychomotor Activity:  Normal  Concentration:  Concentration: Fair and Attention Span: Fair  Recall:  [employer] of Knowledge: Fair  Language: Fair  Akathisia:  No  Handed:  Right  AIMS (if indicated): done  Assets:  Communication Skills Desire for Improvement Housing Social Support Transportation  ADL's:  Intact  Cognition: WNL   Sleep:  Fair   Screenings: AIMS    Flowsheet Row Office Visit from 01/23/2022 in [facility] Office Visit from 10/24/2021 in [facility]  AIMS Total Score 0 0      GAD-7    Flowsheet Row Office Visit from 01/23/2022 in [facility] Video Visit from 07/18/2021 in [facility]  Total GAD-7 Score 0 0      PHQ2-9    Flowsheet Row Office Visit from 01/23/2022 in [facility] Office Visit from 10/24/2021 in [facility] Video Visit from 07/18/2021 in [facility] Office Visit from 12/20/2020 in [facility]  PHQ-2 Total Score 0 0 0 0      Flowsheet Row Office Visit from 01/23/2022 in [facility] Admission (Discharged) from 05/19/2021 in [facility] Office Visit  from 12/20/2020 in [facility]  C-SSRS RISK CATEGORY No Risk No Risk No Risk        Assessment and Plan: MAURO ARPS is a 46 year old African-American male who has a history of schizophrenia, hypertension, hyperlipidemia was evaluated in office today.  Patient is currently stable.  Plan Schizophrenia-stable Risperidone 4 mg p.o. nightly Cogentin 1 mg as needed for side effects of risperidone Aims-0  Follow-up in clinic in 4 months or sooner if needed.   This note was generated in part or whole with voice recognition software. Voice recognition is usually quite accurate but there are transcription errors that can and very often do occur. I apologize for any typographical errors that were not detected and corrected.      William Longs, MD 01/23/2022, 1:26 PM

## 2022-03-22 DIAGNOSIS — E6609 Other obesity due to excess calories: Secondary | ICD-10-CM | POA: Insufficient documentation

## 2022-05-15 ENCOUNTER — Encounter: Payer: Self-pay | Admitting: Psychiatry

## 2022-05-15 ENCOUNTER — Telehealth (INDEPENDENT_AMBULATORY_CARE_PROVIDER_SITE_OTHER): Payer: 59 | Admitting: Psychiatry

## 2022-05-15 DIAGNOSIS — F209 Schizophrenia, unspecified: Secondary | ICD-10-CM | POA: Diagnosis not present

## 2022-05-15 NOTE — Progress Notes (Signed)
Virtual Visit via Video Note  I connected with William Oconnor on 05/15/22 at 10:30 AM EST by a video enabled telemedicine application and verified that I am speaking with the correct person using two identifiers.  Location Provider Location : [facility] Patient Location : Home  Participants: Patient , Provider    I discussed the limitations of evaluation and management by telemedicine and the availability of in person appointments. The patient expressed understanding and agreed to proceed.  I discussed the assessment and treatment plan with the patient. The patient was provided an opportunity to ask questions and all were answered. The patient agreed with the plan and demonstrated an understanding of the instructions.   The patient was advised to call back or seek an in-person evaluation if the symptoms worsen or if the condition fails to improve as anticipated.   William Oconnor OP Progress Note  05/15/2022 10:47 AM William Oconnor  MRN:  [id number]  Chief Complaint:  Chief Complaint  Patient presents with   Follow-up   Anxiety   Medication Refill   HPI: William Oconnor is a 46 year old African-American male, single, lives in [place], on disability, has a history of schizophrenia, hypertension, hyperlipidemia was evaluated by telemedicine today.  Patient today reports overall he is doing well.  Denies any significant depression.  Denies any nervousness, anxiety symptoms.  Reports sleep continues to be good.  Reports appetite is fair.  Trying to watch his diet and exercise more frequently.  Reports his primary care provider brought into his attention that he has gained a few pounds.  Patient reports he is compliant on the risperidone.  Denies any abnormal involuntary movements.  Patient denies any suicidality, homicidality or perceptual disturbances.  Continues to have good support system from his family.  Denies any other concerns today.    Visit Diagnosis:    ICD-10-CM   1.  Schizophrenia, chronic condition (William Oconnor)  F20.9       Past Psychiatric History: Reviewed past psych history from progress note on 04/28/2020.  Past trials of medications, risperidone, Cogentin.  Past Medical History:  Past Medical History:  Diagnosis Date   Asthma    Elevated PSA    Hyperlipidemia    Hypertension    Schizophrenia ([facility])     Past Surgical History:  Procedure Laterality Date   COLONOSCOPY WITH PROPOFOL N/A 05/19/2021   Procedure: COLONOSCOPY WITH PROPOFOL;  Surgeon: Lesly Rubenstein, Oconnor;  Location: [facility] ENDOSCOPY;  Service: Endoscopy;  Laterality: N/A;    Family Psychiatric History: Reviewed family psychiatric history from progress note on 04/28/2020.  Family History:  Family History  Problem Relation Age of Onset   Schizophrenia Mother     Social History: Reviewed social history from progress note on 04/28/2020. Social History   Socioeconomic History   Marital status: Single    Spouse name: Not on file   Number of children: Not on file   Years of education: 64 TH GRADE   Highest education level: Not on file  Occupational History   Not on file  Tobacco Use   Smoking status: Former    Types: Cigarettes, Cigars    Quit date: 04/28/2010    Years since quitting: 12.0   Smokeless tobacco: Never  Vaping Use   Vaping Use: Never used  Substance and Sexual Activity   Alcohol use: Not Currently   Drug use: Not Currently    Types: Marijuana   Sexual activity: Yes  Other Topics Concern   Not on file  Social History Narrative   Not on file   Social Determinants of Health   Financial Resource Strain: Not on file  Food Insecurity: Not on file  Transportation Needs: Not on file  Physical Activity: Not on file  Stress: Not on file  Social Connections: Not on file    Allergies:  Allergies  Allergen Reactions   Atorvastatin Other (See Comments)    Transaminitis     Metabolic Disorder Labs: No results found for: "HGBA1C", "MPG" Lab Results   Component Value Date   PROLACTIN 16.9 (H) 12/20/2020   No results found for: "CHOL", "TRIG", "HDL", "CHOLHDL", "VLDL", "LDLCALC" Lab Results  Component Value Date   TSH 1.009 Test methodology is 3rd generation TSH 07/31/2009    Therapeutic Level Labs: No results found for: "LITHIUM" No results found for: "VALPROATE" No results found for: "CBMZ"  Current Medications: Current Outpatient Medications  Medication Sig Dispense Refill   amLODipine (NORVASC) 10 MG tablet Take 1 tablet by mouth daily.     benztropine (COGENTIN) 1 MG tablet TAKE 1 TABLET BY MOUTH  DAILY AS NEEDED FOR TREMORS AND SIDE EFFECTS OF  RISPERIDONE 90 tablet 3   Calcium Carbonate-Vitamin D 600-400 MG-UNIT tablet Take 2 tablets by mouth daily.     fluticasone (FLONASE) 50 MCG/ACT nasal spray Place into the nose.     fluticasone-salmeterol (ADVAIR) 100-50 MCG/ACT AEPB Inhale into the lungs.     pantoprazole (PROTONIX) 20 MG tablet Take 1 tablet by mouth 2 (two) times daily before a meal.     polyethylene glycol-electrolytes (NULYTELY) 420 g solution Take 4,000 mLs by mouth as directed.     pravastatin (PRAVACHOL) 40 MG tablet Take 1 tablet by mouth at bedtime.     risperidone (RISPERDAL) 4 MG tablet TAKE 1 TABLET BY MOUTH AT  BEDTIME 90 tablet 3   WIXELA INHUB 100-50 MCG/ACT AEPB Inhale into the lungs.     albuterol (VENTOLIN HFA) 108 (90 Base) MCG/ACT inhaler Inhale into the lungs.     No current facility-administered medications for this visit.     Musculoskeletal: Strength & Muscle Tone:  UTA Gait & Station:  Seated Patient leans: N/A  Psychiatric Specialty Exam: Review of Systems  Psychiatric/Behavioral: Negative.    All other systems reviewed and are negative.   There were no vitals taken for this visit.There is no height or weight on file to calculate BMI.  General Appearance: Casual  Eye Contact:  Fair  Speech:  Clear and Coherent  Volume:  Normal  Mood:  Euthymic  Affect:  Congruent  Thought  Process:  Goal Directed and Descriptions of Associations: Intact  Orientation:  Full (Time, Place, and Person)  Thought Content: Logical   Suicidal Thoughts:  No  Homicidal Thoughts:  No  Memory:  Immediate;   Fair Recent;   Fair Remote;   Fair  Judgement:  Fair  Insight:  Fair  Psychomotor Activity:  Normal  Concentration:  Concentration: Fair and Attention Span: Fair  Recall:  [employer] of Knowledge: Fair  Language: Fair  Akathisia:  No  Handed:  Right  AIMS (if indicated):  done  Assets:  Communication Skills Desire for Improvement Housing Social Support  ADL's:  Intact  Cognition: WNL  Sleep:  Fair   Screenings: AIMS    Flowsheet Row Video Visit from 05/15/2022 in [facility] Office Visit from 01/23/2022 in [facility] Office Visit from 10/24/2021 in [facility]  AIMS Total Score 0 0  0      GAD-7    Flowsheet Row Office Visit from 01/23/2022 in [facility] Video Visit from 07/18/2021 in [facility]  Total GAD-7 Score 0 0      PHQ2-9    Flowsheet Row Video Visit from 05/15/2022 in [facility] Office Visit from 01/23/2022 in [facility] Office Visit from 10/24/2021 in [facility] Video Visit from 07/18/2021 in [facility] Office Visit from 12/20/2020 in [facility]  PHQ-2 Total Score 0 0 0 0 0      Flowsheet Row Video Visit from 05/15/2022 in [facility] Office Visit from 01/23/2022 in [facility] Admission (Discharged) from 05/19/2021 in [facility]  C-SSRS RISK CATEGORY No Risk No Risk No Risk        Assessment and Plan: William Oconnor is a 46 year old African-American male who has a history of  schizophrenia, hypertension, hyperlipidemia was evaluated by telemedicine today.  Patient is currently stable.  Plan Schizophrenia-stable Risperidone 4 mg p.o. nightly Cogentin 1 mg as needed for side effects of risperidone  Reviewed and discussed most recent labs dated 03/15/2022-lipid panel-abnormal, patient to continue to follow up with primary care provider. Patient may benefit from repeat TSH level-last 1 done in 2021.  Patient to discuss with primary care provider.   Follow-up in clinic in 4 to 5 months or sooner if needed.   Consent: Patient/Guardian gives verbal consent for treatment and assignment of benefits for services provided during this visit. Patient/Guardian expressed understanding and agreed to proceed.   This note was generated in part or whole with voice recognition software. Voice recognition is usually quite accurate but there are transcription errors that can and very often do occur. I apologize for any typographical errors that were not detected and corrected.      Jomarie Longs, Oconnor 05/15/2022, 10:47 AM

## 2022-07-25 DIAGNOSIS — H524 Presbyopia: Secondary | ICD-10-CM | POA: Diagnosis not present

## 2022-07-27 ENCOUNTER — Other Ambulatory Visit: Payer: Self-pay | Admitting: Psychiatry

## 2022-07-27 DIAGNOSIS — F209 Schizophrenia, unspecified: Secondary | ICD-10-CM

## 2022-07-27 DIAGNOSIS — Z0100 Encounter for examination of eyes and vision without abnormal findings: Secondary | ICD-10-CM | POA: Diagnosis not present

## 2022-08-30 DIAGNOSIS — J358 Other chronic diseases of tonsils and adenoids: Secondary | ICD-10-CM | POA: Diagnosis not present

## 2022-08-30 DIAGNOSIS — R6889 Other general symptoms and signs: Secondary | ICD-10-CM | POA: Diagnosis not present

## 2022-08-30 DIAGNOSIS — R053 Chronic cough: Secondary | ICD-10-CM | POA: Diagnosis not present

## 2022-08-30 DIAGNOSIS — J4540 Moderate persistent asthma, uncomplicated: Secondary | ICD-10-CM | POA: Diagnosis not present

## 2022-09-13 ENCOUNTER — Ambulatory Visit: Payer: Medicare HMO | Admitting: Psychiatry

## 2022-09-13 ENCOUNTER — Encounter: Payer: Self-pay | Admitting: Psychiatry

## 2022-09-13 VITALS — BP 129/78 | HR 103 | Temp 97.9°F | Ht 70.0 in | Wt 202.0 lb

## 2022-09-13 DIAGNOSIS — F209 Schizophrenia, unspecified: Secondary | ICD-10-CM

## 2022-09-13 NOTE — Progress Notes (Signed)
William Oconnor  09/13/2022 10:52 AM William Oconnor  MRN:  [id number]  Chief Complaint:  Chief Complaint  Patient presents with   Follow-up   Schizophrenia   Medication Refill   HPI: William Oconnor is a 47 year old African-American male, single, lives in [place], on disability, has a history of schizophrenia, hypertension, hyperlipidemia was evaluated in office today.  Patient today reports overall he has been managing his mood symptoms well.  Whenever he has an episode of negative thoughts he is able to distract himself by listening to music, going out for a walk.  He does go to the gym and make sure he exercises.  He has been watching his diet.    Currently compliant on the risperidone.  Has had episodes of stiffness of upper extremities on and off however when he takes the Cogentin it resolves.  Does not have it every day.  Currently denies any stiffness.  Denies any side effects otherwise to any of his medications.  Patient appeared to be alert, oriented to person place time situation. Three word memory immediate 3 out of 3, after 5 minutes 2 out of 3.  Was able to spell the word 'world', forward but not backward.  Patient was unable to do subtraction or calculation.  Patient reports good social support system.  Denies any other concerns today.  Visit Diagnosis:    ICD-10-CM   1. Schizophrenia, chronic condition ([place])  F20.9       Past Psychiatric History: I have reviewed past psychiatric history from progress Oconnor on 04/28/2020.  Past trials of medications like risperidone, Cogentin.  Past Medical History:  Past Medical History:  Diagnosis Date   Asthma    Elevated PSA    Hyperlipidemia    Hypertension    Schizophrenia ([place])     Past Surgical History:  Procedure Laterality Date   COLONOSCOPY WITH PROPOFOL N/A 05/19/2021   Procedure: COLONOSCOPY WITH PROPOFOL;  Surgeon: Lesly Rubenstein, MD;  Location: [facility] ENDOSCOPY;  Service: Endoscopy;  Laterality: N/A;     Family Psychiatric History: Reviewed family psychiatric history from progress Oconnor on 04/28/2020.  Family History:  Family History  Problem Relation Age of Onset   Schizophrenia Mother     Social History: Reviewed social history from progress Oconnor on 04/28/2020. Social History   Socioeconomic History   Marital status: Single    Spouse name: Not on file   Number of children: Not on file   Years of education: 27 TH GRADE   Highest education level: Not on file  Occupational History   Not on file  Tobacco Use   Smoking status: Former    Types: Cigarettes, Cigars    Quit date: 04/28/2010    Years since quitting: 12.3   Smokeless tobacco: Never  Vaping Use   Vaping Use: Never used  Substance and Sexual Activity   Alcohol use: Not Currently   Drug use: Not Currently    Types: Marijuana   Sexual activity: Yes  Other Topics Concern   Not on file  Social History Narrative   Not on file   Social Determinants of Health   Financial Resource Strain: Not on file  Food Insecurity: Not on file  Transportation Needs: Not on file  Physical Activity: Not on file  Stress: Not on file  Social Connections: Not on file    Allergies:  Allergies  Allergen Reactions   Atorvastatin Other (See Comments)    Transaminitis     Metabolic Disorder  Labs: No results found for: "HGBA1C", "MPG" Lab Results  Component Value Date   PROLACTIN 16.9 (H) 12/20/2020   No results found for: "CHOL", "TRIG", "HDL", "CHOLHDL", "VLDL", "LDLCALC" Lab Results  Component Value Date         Therapeutic Level Labs: No results found for: "LITHIUM" No results found for: "VALPROATE" No results found for: "CBMZ"  Current Medications: Current Outpatient Medications  Medication Sig Dispense Refill   amLODipine (NORVASC) 10 MG tablet Take 1 tablet by mouth daily.     benztropine (COGENTIN) 1 MG tablet TAKE 1 TABLET BY MOUTH  DAILY AS NEEDED FOR TREMORS AND SIDE EFFECTS OF  RISPERIDONE 90 tablet 3    Calcium Carbonate-Vitamin D 600-400 MG-UNIT tablet Take 2 tablets by mouth daily.     fluticasone (FLONASE) 50 MCG/ACT nasal spray Place into the nose.     pantoprazole (PROTONIX) 20 MG tablet Take 1 tablet by mouth 2 (two) times daily before a meal.     polyethylene glycol-electrolytes (NULYTELY) 420 g solution Take 4,000 mLs by mouth as directed.     pravastatin (PRAVACHOL) 40 MG tablet Take 1 tablet by mouth at bedtime.     risperidone (RISPERDAL) 4 MG tablet TAKE 1 TABLET BY MOUTH AT  BEDTIME 90 tablet 3   WIXELA INHUB 100-50 MCG/ACT AEPB Inhale into the lungs.     albuterol (VENTOLIN HFA) 108 (90 Base) MCG/ACT inhaler Inhale into the lungs.     fluticasone-salmeterol (ADVAIR) 100-50 MCG/ACT AEPB Inhale into the lungs.     No current facility-administered medications for this visit.     Musculoskeletal: Strength & Muscle Tone: within normal limits Gait & Station: normal Patient leans: N/A  Psychiatric Specialty Exam: Review of Systems  Musculoskeletal:        Some stiffness of upper extremities on and off , resolves with cogentin  Psychiatric/Behavioral: Negative.    All other systems reviewed and are negative.   Blood pressure 129/78, pulse (!) 103, temperature 97.9 F (36.6 C), temperature source Skin, height 5\' 10"  (1.778 m), weight 202 lb (91.6 kg).Body mass index is 28.98 kg/m.  General Appearance: Casual  Eye Contact:  Fair  Speech:  Clear and Coherent  Volume:  Normal  Mood:  Euthymic  Affect:  Congruent  Thought Process:  Goal Directed and Descriptions of Associations: Intact  Orientation:  Full (Time, Place, and Person)  Thought Content: Logical   Suicidal Thoughts:  No  Homicidal Thoughts:  No  Memory:  Immediate;   Fair Recent;   Fair Remote;   Fair  Judgement:  Fair  Insight:  Fair  Psychomotor Activity:  Normal  Concentration:  Concentration: Fair and Attention Span: Fair  Recall:  [employer] of Knowledge: Fair  Language: Fair  Akathisia:  No   Handed:  Right  AIMS (if indicated): done  Assets:  Communication Skills Desire for Improvement Housing Social Support  ADL's:  Intact  Cognition: WNL  Sleep:  Fair   Screenings: Administrator, Civil Service Office Visit from 09/13/2022 in [place] Video Visit from 05/15/2022 in [place] Office Visit from 01/23/2022 in [place] Office Visit from 10/24/2021 in [place] Total Score 0 0 0 [place] Office Visit from 01/23/2022 in [place] Video Visit from 07/18/2021 in [facility]  [facility]  Total GAD-7 Score 0 0      PHQ2-9    Flowsheet Row Video Visit from 05/15/2022 in [place] Office Visit from 01/23/2022 in [place] Office Visit from 10/24/2021 in [place] Video Visit from 07/18/2021 in [place] Office Visit from 12/20/2020 in [place]  PHQ-2 Total Score 0 0 0 0 0      [place] Office Visit from 09/13/2022 in [place] Video Visit from 05/15/2022 in [place] Office Visit from 01/23/2022 in [place] No Risk No Risk No Risk        Assessment and Plan: William Oconnor is a 47 year old African-American male who has a history of schizophrenia, hypertension, hyperlipidemia was evaluated in office today.  Patient is currently stable, although does have possible side effects to risperidone, it is currently manageable with Cogentin.  Plan as noted  below.  Plan Schizophrenia-stable Risperidone 4 mg p.o. nightly.  Will consider reducing the dosage in the future if he has any worsening side effects. Continue Cogentin 1 mg as needed for side effects of risperidone.  Patient encouraged to follow up with primary care provider for annual physical exam and to repeat labs including metabolic panel as needed especially since he is on risperidone.  Patient advised to sign an ROI to obtain medical records from primary care provider and recent labs.  Follow-up in clinic in 3 to 4 months or sooner if needed.   Collaboration of Care: Collaboration of Care: Other patient advised to sign an ROI to obtain medical records from primary provider including most recent labs.  Patient/Guardian was advised Release of Information must be obtained prior to any record release in order to collaborate their care with an outside provider. Patient/Guardian was advised if they have not already done so to contact the registration department to sign all necessary forms in order for [place] to release information regarding their care.   Consent: Patient/Guardian gives verbal consent for treatment and assignment of benefits for services provided during this visit. Patient/Guardian expressed understanding and agreed to proceed.   This Oconnor was generated in part or whole with voice recognition software. Voice recognition is usually quite accurate but there are transcription errors that can and very often do occur. I apologize for any typographical errors that were not detected and corrected.    Ursula Alert, MD 09/14/2022, 8:31 AM

## 2022-09-14 DIAGNOSIS — E782 Mixed hyperlipidemia: Secondary | ICD-10-CM | POA: Diagnosis not present

## 2022-09-14 DIAGNOSIS — Z125 Encounter for screening for malignant neoplasm of prostate: Secondary | ICD-10-CM | POA: Diagnosis not present

## 2022-09-14 DIAGNOSIS — R6889 Other general symptoms and signs: Secondary | ICD-10-CM | POA: Diagnosis not present

## 2022-09-21 DIAGNOSIS — E782 Mixed hyperlipidemia: Secondary | ICD-10-CM | POA: Diagnosis not present

## 2022-09-21 DIAGNOSIS — I10 Essential (primary) hypertension: Secondary | ICD-10-CM | POA: Diagnosis not present

## 2022-10-01 ENCOUNTER — Telehealth: Payer: Self-pay

## 2022-10-01 DIAGNOSIS — F209 Schizophrenia, unspecified: Secondary | ICD-10-CM

## 2022-10-01 MED ORDER — RISPERIDONE 4 MG PO TABS: 4.0000 mg | ORAL_TABLET | Freq: Every day | ORAL | 1 refills | Status: DC

## 2022-10-01 NOTE — Telephone Encounter (Signed)
pt notified that rx sent to [facility]

## 2022-10-01 NOTE — Telephone Encounter (Signed)
pt called states that his insurance has changed and that the rx went to the wrong pharmacy. he has to use [facility].

## 2022-10-01 NOTE — Addendum Note (Signed)
Addended byUrsula Alert on: 10/01/2022 04:52 PM   Modules accepted: Orders

## 2022-10-01 NOTE — Telephone Encounter (Signed)
left message for patient that rx was sent to [facility] .

## 2022-10-01 NOTE — Telephone Encounter (Signed)
Patient called to report that his medication was sent to the wrong pharmacy which is [facility] I called the pharmacy and nothing had been sent out since last year due to patient changing insurance he now use [facility] which has been updated in his chart

## 2022-10-01 NOTE — Telephone Encounter (Signed)
Reviewed CMA 's notes that she verified with pharmacy and that no script was sent out this year from [facility]. Hence I have sent a new refill for Risperidone 4 mg to [facility].

## 2022-10-01 NOTE — Telephone Encounter (Signed)
Patient called to request a refill for the following medication please advise   Last visit 09/13/22 Next visit 01/15/23    risperidone (RISPERDAL) 4 MG tablet   Pharmacy [facility] - [place], [place] Phone: [phone]  Fax: [phone]

## 2022-10-01 NOTE — Telephone Encounter (Signed)
Done, Sent another phone note.

## 2022-10-08 DIAGNOSIS — R6889 Other general symptoms and signs: Secondary | ICD-10-CM | POA: Diagnosis not present

## 2022-10-08 DIAGNOSIS — J358 Other chronic diseases of tonsils and adenoids: Secondary | ICD-10-CM | POA: Diagnosis not present

## 2022-11-22 DIAGNOSIS — E782 Mixed hyperlipidemia: Secondary | ICD-10-CM | POA: Diagnosis not present

## 2022-11-22 DIAGNOSIS — I10 Essential (primary) hypertension: Secondary | ICD-10-CM | POA: Diagnosis not present

## 2022-11-22 DIAGNOSIS — R0789 Other chest pain: Secondary | ICD-10-CM | POA: Diagnosis not present

## 2023-01-15 ENCOUNTER — Encounter: Payer: Self-pay | Admitting: Psychiatry

## 2023-01-15 ENCOUNTER — Telehealth (INDEPENDENT_AMBULATORY_CARE_PROVIDER_SITE_OTHER): Payer: Medicare HMO | Admitting: Psychiatry

## 2023-01-15 DIAGNOSIS — F209 Schizophrenia, unspecified: Secondary | ICD-10-CM | POA: Diagnosis not present

## 2023-01-15 NOTE — Progress Notes (Signed)
Virtual Visit via Video Note  I connected with William Oconnor on 01/15/23 at  3:30 PM EDT by a video enabled telemedicine application and verified that I am speaking with the correct person using two identifiers.  Location Provider Location : Remote Office  Patient Location : Home  Participants: Patient , Provider   I discussed the limitations of evaluation and management by telemedicine and the availability of in person appointments. The patient expressed understanding and agreed to proceed.   I discussed the assessment and treatment plan with the patient. The patient was provided an opportunity to ask questions and all were answered. The patient agreed with the plan and demonstrated an understanding of the instructions.   The patient was advised to call back or seek an in-person evaluation if the symptoms worsen or if the condition fails to improve as anticipated.                                                                                       BH MD OP Progress Note  01/16/2023 7:55 AM SIDDHARTHA DORSEY  MRN:  [id number]  Chief Complaint:  Chief Complaint  Patient presents with   Follow-up   schiophrenia'   Medication Refill   HPI: William Oconnor is a 47 year old African-American male, single, lives in [place], on disability, has a history of schizophrenia, hypertension, hyperlipidemia was evaluated by telemedicine today.  Patient today reports he is doing well.  Denies any significant depression or anxiety symptoms.  Reports he does have episodes when he has hallucinations of hearing voices.  The last time it may have happened was a month ago.  Patient reports he is able to cope by distracting himself by talking to a family member, going out for a walk or listening to music.  Patient reports sleep is overall good.  Patient reports appetite is fair.  Patient denies any suicidality, homicidality or perceptual disturbances.  Patient is currently compliant on the risperidone,  denies side effects.  Does have benztropine available which she uses on an as-needed basis for side effects of risperidone.  Patient appeared to be alert, oriented to person place time situation.  3 word memory immediate 3 out of 3, after 5 minutes 3 out of 3.  Patient denies any other concerns today.  Visit Diagnosis:    ICD-10-CM   1. Schizophrenia, chronic condition (HCC)  F20.9       Past Psychiatric History: I have reviewed past psychiatric history from progress note on 04/28/2020.  Past trials of medications like risperidone, Cogentin.  Past Medical History:  Past Medical History:  Diagnosis Date   Asthma    Elevated PSA    Hyperlipidemia    Hypertension    Schizophrenia (HCC)     Past Surgical History:  Procedure Laterality Date   COLONOSCOPY WITH PROPOFOL N/A 05/19/2021   Procedure: COLONOSCOPY WITH PROPOFOL;  Surgeon: Regis Bill, MD;  Location: [facility] ENDOSCOPY;  Service: Endoscopy;  Laterality: N/A;    Family Psychiatric History: I have reviewed family psychiatric history from progress note on 04/28/2020.  Family History:  Family History  Problem Relation Age of Onset   Schizophrenia Mother  Social History: I have reviewed social history from progress note on 04/28/2020. Social History   Socioeconomic History   Marital status: Single    Spouse name: Not on file   Number of children: Not on file   Years of education: 27 TH GRADE   Highest education level: Not on file  Occupational History   Not on file  Tobacco Use   Smoking status: Former    Types: Cigarettes, Cigars    Quit date: 04/28/2010    Years since quitting: 12.7   Smokeless tobacco: Never  Vaping Use   Vaping Use: Never used  Substance and Sexual Activity   Alcohol use: Not Currently   Drug use: Not Currently    Types: Marijuana   Sexual activity: Yes  Other Topics Concern   Not on file  Social History Narrative   Not on file   Social Determinants of Health   Financial  Resource Strain: Not on file  Food Insecurity: Not on file  Transportation Needs: Not on file  Physical Activity: Not on file  Stress: Not on file  Social Connections: Not on file    Allergies:  Allergies  Allergen Reactions   Atorvastatin Other (See Comments)    Transaminitis     Metabolic Disorder Labs: No results found for: "HGBA1C", "MPG" Lab Results  Component Value Date   PROLACTIN 16.9 (H) 12/20/2020   No results found for: "CHOL", "TRIG", "HDL", "CHOLHDL", "VLDL", "LDLCALC" Lab Results  Component Value Date   TSH 1.009 Test methodology is 3rd generation TSH 07/31/2009    Therapeutic Level Labs: No results found for: "LITHIUM" No results found for: "VALPROATE" No results found for: "CBMZ"  Current Medications: Current Outpatient Medications  Medication Sig Dispense Refill   albuterol (VENTOLIN HFA) 108 (90 Base) MCG/ACT inhaler Inhale into the lungs.     amLODipine (NORVASC) 10 MG tablet Take 1 tablet by mouth daily.     benztropine (COGENTIN) 1 MG tablet TAKE 1 TABLET BY MOUTH  DAILY AS NEEDED FOR TREMORS AND SIDE EFFECTS OF  RISPERIDONE 90 tablet 3   Calcium Carbonate-Vitamin D 600-400 MG-UNIT tablet Take 2 tablets by mouth daily.     fluticasone (FLONASE) 50 MCG/ACT nasal spray Place into the nose.     fluticasone-salmeterol (ADVAIR) 100-50 MCG/ACT AEPB Inhale into the lungs.     pantoprazole (PROTONIX) 20 MG tablet Take 1 tablet by mouth 2 (two) times daily before a meal.     polyethylene glycol-electrolytes (NULYTELY) 420 g solution Take 4,000 mLs by mouth as directed.     pravastatin (PRAVACHOL) 40 MG tablet Take 1 tablet by mouth at bedtime.     risperidone (RISPERDAL) 4 MG tablet Take 1 tablet (4 mg total) by mouth at bedtime. 90 tablet 1   WIXELA INHUB 100-50 MCG/ACT AEPB Inhale into the lungs.     No current facility-administered medications for this visit.     Musculoskeletal: Strength & Muscle Tone:  UTA Gait & Station:  Seated Patient leans:  N/A  Psychiatric Specialty Exam: Review of Systems  Psychiatric/Behavioral: Negative.      There were no vitals taken for this visit.There is no height or weight on file to calculate BMI.  General Appearance: Fairly Groomed  Eye Contact:  Fair  Speech:  Normal Rate  Volume:  Normal  Mood:  Euthymic  Affect:  Congruent  Thought Process:  Goal Directed and Descriptions of Associations: Intact  Orientation:  Full (Time, Place, and Person)  Thought Content: Logical  Suicidal Thoughts:  No  Homicidal Thoughts:  No  Memory:  Immediate;   Fair Recent;   Fair Remote;   Fair  Judgement:  Fair  Insight:  Fair  Psychomotor Activity:  Normal  Concentration:  Concentration: Fair and Attention Span: Fair  Recall:  [employer] of Knowledge: Fair  Language: Fair  Akathisia:  No  Handed:  Right  AIMS (if indicated): not done  Assets:  Communication Skills Desire for Improvement Housing Social Support Transportation  ADL's:  Intact  Cognition: WNL  Sleep:  Fair   Screenings: Geneticist, molecular Office Visit from 09/13/2022 in [facility] Video Visit from 05/15/2022 in [facility] Office Visit from 01/23/2022 in [facility] Office Visit from 10/24/2021 in [facility]  AIMS Total Score 0 0 0 0      GAD-7    Flowsheet Row Office Visit from 01/23/2022 in [facility] Video Visit from 07/18/2021 in [facility]  Total GAD-7 Score 0 0      PHQ2-9    Flowsheet Row Video Visit from 05/15/2022 in [facility] Office Visit from 01/23/2022 in [facility] Office Visit from 10/24/2021 in [facility] Video Visit from 07/18/2021 in [facility] Office Visit from 12/20/2020 in [facility]  PHQ-2 Total Score 0 0 0 0 0      Flowsheet Row Video Visit from 01/15/2023 in [facility] Office Visit from 09/13/2022 in [facility] Video Visit from 05/15/2022 in [facility]  C-SSRS RISK CATEGORY No Risk No Risk No Risk        Assessment and Plan: AMIEL TOELLE is a 47 year old African-American male who has a history of schizophrenia, hypertension, hyperlipidemia was evaluated by telemedicine today.  Patient is currently stable.  Plan as noted below.  Plan Schizophrenia-stable Risperidone 4 mg p.o. nightly. Continue Cogentin 1 mg as needed for side effects of risperidone.  I have reviewed labs-dated 09/14/2022-lipid panel-within normal limits, CMP-within normal limits.  Pending labs-hemoglobin A1c, prolactin level.  If patient unable to get it done prior to next appointment will order these labs.  Follow-up in clinic in 5 to 6 months in person.      Consent: Patient/Guardian gives verbal consent for treatment and assignment of benefits for services provided during this visit. Patient/Guardian expressed understanding and agreed to proceed.   This note was generated in part or whole with voice recognition software. Voice recognition is usually quite accurate but there are transcription errors that can and very often do occur. I apologize for any typographical errors that were not detected and corrected.      Jomarie Longs, MD 01/16/2023, 7:55 AM

## 2023-02-07 ENCOUNTER — Other Ambulatory Visit: Payer: Self-pay | Admitting: Psychiatry

## 2023-02-07 DIAGNOSIS — F209 Schizophrenia, unspecified: Secondary | ICD-10-CM

## 2023-03-18 DIAGNOSIS — E782 Mixed hyperlipidemia: Secondary | ICD-10-CM | POA: Diagnosis not present

## 2023-03-18 DIAGNOSIS — I10 Essential (primary) hypertension: Secondary | ICD-10-CM | POA: Diagnosis not present

## 2023-03-25 DIAGNOSIS — E669 Obesity, unspecified: Secondary | ICD-10-CM | POA: Diagnosis not present

## 2023-03-25 DIAGNOSIS — Z1331 Encounter for screening for depression: Secondary | ICD-10-CM | POA: Diagnosis not present

## 2023-03-25 DIAGNOSIS — E785 Hyperlipidemia, unspecified: Secondary | ICD-10-CM | POA: Diagnosis not present

## 2023-03-25 DIAGNOSIS — Z6830 Body mass index (BMI) 30.0-30.9, adult: Secondary | ICD-10-CM | POA: Diagnosis not present

## 2023-03-25 DIAGNOSIS — F209 Schizophrenia, unspecified: Secondary | ICD-10-CM | POA: Diagnosis not present

## 2023-03-25 DIAGNOSIS — Z0000 Encounter for general adult medical examination without abnormal findings: Secondary | ICD-10-CM | POA: Diagnosis not present

## 2023-03-25 DIAGNOSIS — I10 Essential (primary) hypertension: Secondary | ICD-10-CM | POA: Diagnosis not present

## 2023-05-30 ENCOUNTER — Ambulatory Visit: Payer: Medicare HMO | Admitting: Psychiatry

## 2023-05-30 ENCOUNTER — Encounter: Payer: Self-pay | Admitting: Psychiatry

## 2023-05-30 VITALS — BP 145/84 | HR 103 | Temp 99.0°F | Ht 70.0 in | Wt 208.4 lb

## 2023-05-30 DIAGNOSIS — F209 Schizophrenia, unspecified: Secondary | ICD-10-CM | POA: Diagnosis not present

## 2023-05-30 DIAGNOSIS — Z79899 Other long term (current) drug therapy: Secondary | ICD-10-CM

## 2023-05-30 NOTE — Progress Notes (Signed)
BH MD OP Progress Note  05/30/2023 2:52 PM CHEVALIER KARNITZ  MRN:  [id number]  Chief Complaint:  Chief Complaint  Patient presents with   Follow-up   Schizophrenia   Medication Refill   HPI: William Oconnor is a 47 year old African-American male, single, lives in [place] on disability, has a history of schizophrenia, hypertension, hyperlipidemia was evaluated in office today.  Patient today reports he is doing well on the current medication regimen.  Denies any significant anxiety or depression symptoms.  Denies any current hallucinations.  Has not had hallucinations in a long time.  Patient reports he does have stressors and when he does feel anxious or agitated he has good social support system.  His sisters are helpful.  Patient reports sleep is overall good.  Patient reports appetite is fair.  Patient is currently compliant on the risperidone, denies side effects.  Patient appeared to be alert, oriented to person place time situation.  3 word memory immediate 3 out of 3, after 5 minutes 1 out of 3.  Patient with limited concentration in session unable to spell 'WORLD' backward.  Patient unable to do digits forward or backward.  Patient denies any significant memory issues however agrees to keep track of his memory and let this provider know if anything concerning.  Patient denies any other concerns today.  Visit Diagnosis:    ICD-10-CM   1. Schizophrenia, chronic condition (HCC)  F20.9 TSH    Prolactin    Hemoglobin A1C    2. High risk medication use  Z79.899 TSH    Prolactin    Hemoglobin A1C      Past Psychiatric History: I have reviewed past psychiatric history from progress note on 04/28/2020.  Past trials of medications like risperidone, Cogentin.  Past Medical History:  Past Medical History:  Diagnosis Date   Asthma    Elevated PSA    Hyperlipidemia    Hypertension    Schizophrenia (HCC)     Past Surgical History:  Procedure Laterality Date   COLONOSCOPY WITH  PROPOFOL N/A 05/19/2021   Procedure: COLONOSCOPY WITH PROPOFOL;  Surgeon: Regis Bill, MD;  Location: [facility] ENDOSCOPY;  Service: Endoscopy;  Laterality: N/A;    Family Psychiatric History: I have reviewed family psychiatric history from progress note on 04/28/2020.  Family History:  Family History  Problem Relation Age of Onset   Schizophrenia Mother     Social History: I have reviewed social history from progress note on 04/28/2020. Social History   Socioeconomic History   Marital status: Single    Spouse name: Not on file   Number of children: Not on file   Years of education: 88 TH GRADE   Highest education level: Not on file  Occupational History   Not on file  Tobacco Use   Smoking status: Former    Current packs/day: 0.00    Types: Cigarettes, Cigars    Quit date: 04/28/2010    Years since quitting: 13.0   Smokeless tobacco: Never  Vaping Use   Vaping status: Never Used  Substance and Sexual Activity   Alcohol use: Not Currently   Drug use: Not Currently    Types: Marijuana   Sexual activity: Yes  Other Topics Concern   Not on file  Social History Narrative   Not on file   Social Determinants of Health   Financial Resource Strain: Low Risk  (03/25/2023)   Received from [facility]   Overall Financial Resource Strain (CARDIA)  Difficulty of Paying Living Expenses: Not hard at all  Food Insecurity: No Food Insecurity (03/25/2023)   Received from [facility]   Hunger Vital Sign    Worried About Running Out of Food in the Last Year: Never true    Ran Out of Food in the Last Year: Never true  Transportation Needs: No Transportation Needs (03/25/2023)   Received from [facility]    In the past 12 months, has lack of transportation kept you from medical appointments or from getting medications?: No    Lack of Transportation (Non-Medical): No  Physical Activity: Not on file   Stress: Not on file  Social Connections: Not on file    Allergies:  Allergies  Allergen Reactions   Atorvastatin Other (See Comments)    Transaminitis     Metabolic Disorder Labs: No results found for: "HGBA1C", "MPG" Lab Results  Component Value Date   PROLACTIN 16.9 (H) 12/20/2020   No results found for: "CHOL", "TRIG", "HDL", "CHOLHDL", "VLDL", "LDLCALC" Lab Results  Component Value Date   TSH 1.009 Test methodology is 3rd generation TSH 07/31/2009    Therapeutic Level Labs: No results found for: "LITHIUM" No results found for: "VALPROATE" No results found for: "CBMZ"  Current Medications: Current Outpatient Medications  Medication Sig Dispense Refill   amLODipine (NORVASC) 10 MG tablet Take 1 tablet by mouth daily.     benztropine (COGENTIN) 1 MG tablet TAKE 1 TABLET BY MOUTH  DAILY AS NEEDED FOR TREMORS AND SIDE EFFECTS OF  RISPERIDONE 90 tablet 3   BOOSTRIX 5-2.5-18.5 LF-MCG/0.5 injection      Calcium Carbonate-Vitamin D 600-400 MG-UNIT tablet Take 2 tablets by mouth daily.     FLUCELVAX 0.5 ML injection      fluticasone (FLONASE) 50 MCG/ACT nasal spray Place into the nose.     pantoprazole (PROTONIX) 20 MG tablet Take 1 tablet by mouth 2 (two) times daily before a meal.     polyethylene glycol-electrolytes (NULYTELY) 420 g solution Take 4,000 mLs by mouth as directed.     pravastatin (PRAVACHOL) 40 MG tablet Take 1 tablet by mouth at bedtime.     risperidone (RISPERDAL) 4 MG tablet TAKE 1 TABLET AT BEDTIME 90 tablet 3   WIXELA INHUB 100-50 MCG/ACT AEPB Inhale into the lungs.     albuterol (VENTOLIN HFA) 108 (90 Base) MCG/ACT inhaler Inhale into the lungs.     fluticasone-salmeterol (ADVAIR) 100-50 MCG/ACT AEPB Inhale into the lungs.     No current facility-administered medications for this visit.     Musculoskeletal: Strength & Muscle Tone: within normal limits Gait & Station: normal Patient leans: N/A  Psychiatric Specialty Exam: Review of Systems   Psychiatric/Behavioral: Negative.      Blood pressure (!) 145/84, pulse (!) 103, temperature 99 F (37.2 C), temperature source Skin, height 5\' 10"  (1.778 m), weight 208 lb 6.4 oz (94.5 kg).Body mass index is 29.9 kg/m.  General Appearance: Casual  Eye Contact:  Fair  Speech:  Clear and Coherent  Volume:  Normal  Mood:  Euthymic  Affect:  Appropriate  Thought Process:  Goal Directed and Descriptions of Associations: Intact  Orientation:  Full (Time, Place, and Person)  Thought Content: Logical   Suicidal Thoughts:  No  Homicidal Thoughts:  No  Memory:  Immediate;   Fair Recent;   Fair Remote;   Fair  Judgement:  Fair  Insight:  Fair  Psychomotor Activity:  Normal  Concentration:  Concentration:  Fair and Attention Span: Fair  Recall:  [employer] of Knowledge: Fair  Language: Fair  Akathisia:  No  Handed:  Right  AIMS (if indicated): done  Assets:  Desire for Improvement Housing Social Support  ADL's:  Intact  Cognition: WNL  Sleep:  Fair   Screenings: Midwife Visit from 05/30/2023 in [facility] Office Visit from 09/13/2022 in [facility] Video Visit from 05/15/2022 in [facility] Office Visit from 01/23/2022 in [facility] Office Visit from 10/24/2021 in [facility]  AIMS Total Score 0 0 0 0 0      GAD-7    Flowsheet Row Office Visit from 05/30/2023 in [facility] Office Visit from 01/23/2022 in [facility] Video Visit from 07/18/2021 in [facility]  Total GAD-7 Score 1 0 0      PHQ2-9    Flowsheet Row Office Visit from 05/30/2023 in [facility] Video Visit from 05/15/2022 in [facility] Office Visit from 01/23/2022 in [facility] Office Visit from 10/24/2021 in [facility] Video Visit from 07/18/2021 in [facility]  PHQ-2 Total Score 0 0 0 0 0      Flowsheet Row Office Visit from 05/30/2023 in [facility] Video Visit from 01/15/2023 in [facility] Office Visit from 09/13/2022 in [facility]  C-SSRS RISK CATEGORY No Risk No Risk No Risk        Assessment and Plan: DECLAN MWANGI is a 47 year old African-American male who has a history of schizophrenia, hypertension, hyperlipidemia was evaluated in office today.  Patient is currently stable.  Plan as noted below.  Plan Schizophrenia-stable Risperidone 4 mg p.o. nightly Cogentin 1 mg p.o. daily as needed for side effects of risperidone.  Patient uses it rarely.  High risk medication use-will order a hemoglobin A1c, TSH, prolactin level.  Patient to go to [facility] lab. Reviewed and discussed lipid panel dated 03/18/2023-within normal limits, CBC with differential-within normal limits, CMP-within normal limits except for glucose at 156.  Patient with elevated blood pressure reading, patient to monitor blood pressure and follow up with primary care provider.    Consent: Patient/Guardian gives verbal consent for treatment and assignment of benefits for services provided during this visit. Patient/Guardian expressed understanding and agreed to proceed.   Follow-up in clinic in 5 to 6 months or sooner if needed.  This note was generated in part or whole with voice recognition software. Voice recognition is usually quite accurate but there are transcription errors that can and very often do occur. I apologize for any typographical errors that were not  detected and corrected.    Jomarie Longs, MD 05/31/2023, 8:21 AM

## 2023-06-13 ENCOUNTER — Other Ambulatory Visit
Admission: RE | Admit: 2023-06-13 | Discharge: 2023-06-13 | Disposition: A | Discharge status: Home / Self Care | Payer: Medicare HMO | Attending: Psychiatry | Admitting: Psychiatry

## 2023-06-13 DIAGNOSIS — Z79899 Other long term (current) drug therapy: Secondary | ICD-10-CM | POA: Insufficient documentation

## 2023-06-13 DIAGNOSIS — F209 Schizophrenia, unspecified: Secondary | ICD-10-CM | POA: Diagnosis not present

## 2023-06-13 LAB — HEMOGLOBIN A1C
Hgb A1c MFr Bld: 5.4 % (ref 4.8–5.6)
Mean Plasma Glucose: 108.28 mg/dL

## 2023-06-13 LAB — TSH: TSH: 3.408 u[IU]/mL (ref 0.350–4.500)

## 2023-06-14 LAB — PROLACTIN: Prolactin: 13.4 ng/mL (ref 3.9–22.7)

## 2023-06-19 ENCOUNTER — Ambulatory Visit: Payer: Medicare HMO | Admitting: Psychiatry

## 2023-07-12 ENCOUNTER — Telehealth: Payer: Self-pay | Admitting: Psychiatry

## 2023-07-12 NOTE — Telephone Encounter (Signed)
 Please contact previous pharmacy to cancel medications sent out to avoid duplicate therapy. Please check with patient if he is due for any of his medication refills yet and let me know.

## 2023-07-12 NOTE — Telephone Encounter (Signed)
 Patient called to update his insurance to [employer]. Then states he has new pharmacy with mail order prescriptions. The pharmacy is [facility].  Phone number is [phone]   Fax is [phone]. Please contact him if more information is needed.

## 2023-07-15 NOTE — Telephone Encounter (Signed)
 there is no more rxs' on file for patient.

## 2023-07-23 ENCOUNTER — Telehealth: Payer: Self-pay | Admitting: Psychiatry

## 2023-07-23 NOTE — Telephone Encounter (Signed)
 Patient calling to make sure we have new pharmacy. It is now [facility] is a [employer]. I cannot see that information, will you please verify it is in system.

## 2023-07-23 NOTE — Telephone Encounter (Signed)
 Please contact [facility] and cancel prescription for risperidone which is still pending there with refills.  Once that is completed please let me know so I can send new prescription to [facility].

## 2023-07-23 NOTE — Telephone Encounter (Signed)
 The pharmacy is current and up to date

## 2023-07-24 NOTE — Telephone Encounter (Signed)
 called and canceled remaining refills. spoke with elizabeth.

## 2023-07-25 NOTE — Telephone Encounter (Signed)
Contacted patient to verify risperidone refill, he is not due yet.  Patient has enough to last until March 2025.  Agrees to let this provider know when he is due for a refill and it will be sent to his new pharmacy-[facility].

## 2023-09-10 ENCOUNTER — Telehealth: Payer: Self-pay | Admitting: Psychiatry

## 2023-09-10 DIAGNOSIS — F209 Schizophrenia, unspecified: Secondary | ICD-10-CM

## 2023-09-10 NOTE — Telephone Encounter (Signed)
 Please verify with patient to check his refills on his prescription bottle or contact [facility] pharmacy since according to our record a 1 year supply was sent out in August 2024 for risperidone.

## 2023-09-11 MED ORDER — RISPERIDONE 4 MG PO TABS: 4.0000 mg | ORAL_TABLET | Freq: Every day | ORAL | 3 refills | Status: DC

## 2023-09-11 NOTE — Telephone Encounter (Signed)
 called [facility] and the last time they sent rx was aug 2024 for 90 day supply. I canceled remaining refills since pt is not using [facility] anymore. She also confirmed that pt call and let them know that he changed pharmacy

## 2023-09-11 NOTE — Telephone Encounter (Signed)
 pt states he doesn't use [facility] any more that he uses the [facility]. he states he has less than half bottle left.

## 2023-09-11 NOTE — Telephone Encounter (Signed)
 I have sent risperidone to [facility]

## 2023-09-11 NOTE — Telephone Encounter (Signed)
 Pt.notified

## 2023-10-24 ENCOUNTER — Telehealth: Payer: Self-pay

## 2023-10-24 NOTE — Telephone Encounter (Signed)
 Attempted to send a refill request noticed that patient had multiple pharmacies listed called patient he stated that he did not need a refill at this time and would contact the office when he needs a refill

## 2023-11-19 ENCOUNTER — Telehealth: Payer: Self-pay

## 2023-11-19 DIAGNOSIS — F209 Schizophrenia, unspecified: Secondary | ICD-10-CM

## 2023-11-19 NOTE — Telephone Encounter (Signed)
 received fax requesting a refill on the benztropine . pt was last seen on 11-21 next appt 5-21

## 2023-11-20 MED ORDER — BENZTROPINE MESYLATE 1 MG PO TABS: ORAL_TABLET | ORAL | 3 refills | Status: DC

## 2023-11-20 NOTE — Telephone Encounter (Signed)
 I have sent benztropine  to [facility] as requested.

## 2023-11-20 NOTE — Telephone Encounter (Signed)
 Please contact patient to verify what pharmacy he is currently using.  He may have switched to [facility].  Please let me know.

## 2023-11-21 NOTE — Telephone Encounter (Signed)
 Pt.notified

## 2023-11-27 ENCOUNTER — Other Ambulatory Visit: Payer: Self-pay

## 2023-11-27 ENCOUNTER — Ambulatory Visit (INDEPENDENT_AMBULATORY_CARE_PROVIDER_SITE_OTHER): Payer: Self-pay | Admitting: Psychiatry

## 2023-11-27 ENCOUNTER — Encounter: Payer: Self-pay | Admitting: Psychiatry

## 2023-11-27 VITALS — BP 138/84 | HR 88 | Temp 98.0°F | Ht 70.0 in | Wt 203.2 lb

## 2023-11-27 DIAGNOSIS — F209 Schizophrenia, unspecified: Secondary | ICD-10-CM | POA: Diagnosis not present

## 2023-11-27 NOTE — Progress Notes (Signed)
 BH MD OP Progress Note  11/27/2023 11:01 AM William Oconnor  MRN:  [id number]  Chief Complaint:  Chief Complaint  Patient presents with   Follow-up   Medication Refill   Schizophrenia   Discussed the use of AI scribe software for clinical note transcription with the patient, who gave verbal consent to proceed.  History of Present Illness William Oconnor is a 48 year old African-American male, single, lives in [place], on disability, has a history of schizophrenia, hypertension, hyperlipidemia was evaluated in office today, presents for psychiatric follow-up.  He has been managing his mental health well without recent relapses, attributed to adherence to his medication regimen and engaging in positive activities such as walking, watching uplifting television programs, and attending church every Sunday. Previous episodes involved 'strange thoughts' characterized by feelings of depression, sadness, and anxiety about unnecessary matters. During these episodes, he experienced racing thoughts, sadness, and anxiety.   He experiences intermittent sharp pain in his foot, which is not present at the time of the visit. This issue has been discussed with primary care provider.  He has not noticed significant weight changes, reports appetite is fair.  He is currently taking risperidone  and Cogentin , denies side effects.  Appeared to be alert, oriented to person place time situation.  Was able to answer all questions appropriately.    Visit Diagnosis:    ICD-10-CM   1. Schizophrenia, chronic condition (HCC)  F20.9       Past Psychiatric History: I have reviewed past psychiatric history from progress note on 04/28/2020.  Past trials of medications like risperidone , Cogentin   Past Medical History:  Past Medical History:  Diagnosis Date   Asthma    Elevated PSA    Hyperlipidemia    Hypertension    Schizophrenia (HCC)     Past Surgical History:  Procedure Laterality Date   COLONOSCOPY WITH  PROPOFOL  N/A 05/19/2021   Procedure: COLONOSCOPY WITH PROPOFOL ;  Surgeon: Shane Darling, MD;  Location: [facility] ENDOSCOPY;  Service: Endoscopy;  Laterality: N/A;    Family Psychiatric History: I have reviewed family psychiatric history from progress note on 04/28/2020.  Family History:  Family History  Problem Relation Age of Onset   Schizophrenia Mother     Social History: I have reviewed social history from progress note on 04/28/2020. Social History   Socioeconomic History   Marital status: Single    Spouse name: Not on file   Number of children: Not on file   Years of education: 41 TH GRADE   Highest education level: Not on file  Occupational History   Not on file  Tobacco Use   Smoking status: Former    Current packs/day: 0.00    Types: Cigarettes, Cigars    Quit date: 04/28/2010    Years since quitting: 13.5   Smokeless tobacco: Never  Vaping Use   Vaping status: Never Used  Substance and Sexual Activity   Alcohol use: Not Currently   Drug use: Not Currently    Types: Marijuana   Sexual activity: Yes  Other Topics Concern   Not on file  Social History Narrative   Not on file   Social Drivers of Health   Financial Resource Strain: Low Risk  (03/25/2023)   [facility]   Overall Financial Resource Strain (CARDIA)    Difficulty of Paying Living Expenses: Not hard at all  Food Insecurity: No Food Insecurity (03/25/2023)   Received from [facility]   Hunger Vital  Sign    Ran Out of Food in the Last Year: Never true    Worried About Running Out of Food in the Last Year: Never true  Transportation Needs: No Transportation Needs (03/25/2023)   Received from [facility]   PRAPARE - Transportation    Lack of Transportation (Non-Medical): No    In the past 12 months, has lack of transportation kept you from medical appointments or from getting medications?: No  Physical Activity: Not on file   Stress: Not on file  Social Connections: Not on file    Allergies:  Allergies  Allergen Reactions   Atorvastatin Other (See Comments)    Transaminitis     Metabolic Disorder Labs: Lab Results  Component Value Date   HGBA1C 5.4 06/13/2023   MPG 108.28 06/13/2023   Lab Results  Component Value Date   PROLACTIN 13.4 06/13/2023   PROLACTIN 16.9 (H) 12/20/2020   No results found for: "CHOL", "TRIG", "HDL", "CHOLHDL", "VLDL", "LDLCALC" Lab Results  Component Value Date   TSH 3.408 06/13/2023   TSH 1.009 Test methodology is 3rd generation TSH 07/31/2009    Therapeutic Level Labs: No results found for: "LITHIUM" No results found for: "VALPROATE" No results found for: "CBMZ"  Current Medications: Current Outpatient Medications  Medication Sig Dispense Refill   amLODipine (NORVASC) 10 MG tablet Take 1 tablet by mouth daily.     benztropine  (COGENTIN ) 1 MG tablet TAKE 1 TABLET BY MOUTH  DAILY AS NEEDED FOR TREMORS AND SIDE EFFECTS OF  RISPERIDONE  90 tablet 3   BOOSTRIX 5-2.5-18.5 LF-MCG/0.5 injection      Calcium Carbonate-Vitamin D 600-400 MG-UNIT tablet Take 2 tablets by mouth daily.     FLUCELVAX 0.5 ML injection      fluticasone (FLONASE) 50 MCG/ACT nasal spray Place into the nose.     pantoprazole (PROTONIX) 20 MG tablet Take 1 tablet by mouth 2 (two) times daily before a meal.     polyethylene glycol-electrolytes (NULYTELY) 420 g solution Take 4,000 mLs by mouth as directed.     pravastatin (PRAVACHOL) 40 MG tablet Take 1 tablet by mouth at bedtime.     risperidone  (RISPERDAL ) 4 MG tablet Take 1 tablet (4 mg total) by mouth at bedtime. 90 tablet 3   WIXELA INHUB 100-50 MCG/ACT AEPB Inhale into the lungs.     albuterol  (VENTOLIN  HFA) 108 (90 Base) MCG/ACT inhaler Inhale into the lungs.     fluticasone-salmeterol (ADVAIR) 100-50 MCG/ACT AEPB Inhale into the lungs.     No current facility-administered medications for this visit.     Musculoskeletal: Strength & Muscle  Tone: within normal limits Gait & Station: normal Patient leans: N/A  Psychiatric Specialty Exam: Review of Systems  Psychiatric/Behavioral: Negative.      Blood pressure 138/84, pulse 88, temperature 98 F (36.7 C), temperature source Temporal, height 5\' 10"  (1.778 m), weight 203 lb 3.2 oz (92.2 kg).Body mass index is 29.16 kg/m.  General Appearance: Casual  Eye Contact:  Fair  Speech:  Clear and Coherent  Volume:  Normal  Mood:  Euthymic  Affect:  Full Range  Thought Process:  Goal Directed and Descriptions of Associations: Intact  Orientation:  Full (Time, Place, and Person)  Thought Content: Logical   Suicidal Thoughts:  No  Homicidal Thoughts:  No  Memory:  Immediate;   Fair Recent;   Fair Remote;   Fair  Judgement:  Fair  Insight:  Fair  Psychomotor Activity:  Normal  Concentration:  Concentration: Limited and  Attention Span: Fair  Recall:  Limited  Fund of Knowledge: Fair  Language: Fair  Akathisia:  No  Handed:  Right  AIMS (if indicated): done  Assets:  Communication Skills Desire for Improvement Housing Social Support Transportation  ADL's:  Intact  Cognition: WNL  Sleep:  Fair   Screenings: Midwife Visit from 11/27/2023 in [facility] Office Visit from 05/30/2023 in [facility] Office Visit from 09/13/2022 in [facility] Video Visit from 05/15/2022 in [facility] Office Visit from 01/23/2022 in [facility]  AIMS Total Score 0 0 0 0 0      GAD-7    Flowsheet Row Office Visit from 11/27/2023 in [facility] Office Visit from 05/30/2023 in [facility] Office Visit from 01/23/2022 in [facility] Video Visit from 07/18/2021  in [facility]  Total GAD-7 Score 0 1 0 0      PHQ2-9    Flowsheet Row Office Visit from 11/27/2023 in [facility] Office Visit from 05/30/2023 in [facility] Video Visit from 05/15/2022 in [facility] Office Visit from 01/23/2022 in [facility] Office Visit from 10/24/2021 in [facility]  PHQ-2 Total Score 0 0 0 0 0      Flowsheet Row Office Visit from 11/27/2023 in [facility] Office Visit from 05/30/2023 in [facility] Video Visit from 01/15/2023 in [facility]  C-SSRS RISK CATEGORY No Risk No Risk No Risk        Assessment and Plan: William Oconnor is a 48 year old African-American male, has a history of schizophrenia, hypertension, hyperlipidemia was evaluated in office today.  Discussed assessment and plan as noted below.  Schizophrenia-stable Currently well managed on risperidone .  Denies any hallucinations or delusions.  Denies any depression or anxiety and has good social support system and engaged in social activities. - Continue Risperidone  4 mg at bedtime - Continue Cogentin  1 mg daily as needed for side effects of risperidone  - Agrees to check with [facility] regarding transferring to risperidone  refills to [facility] which is his new pharmacy. - If unable to do so he will let this provider know to send new prescription for risperidone  to [facility].  Follow-up Follow-up in clinic in 6 months or sooner if needed.   Collaboration of Care: Collaboration of Care: Primary Care Provider AEB encouraged to follow up with primary care provider as needed.  Patient/Guardian was advised Release of Information must be obtained  prior to any record release in order to collaborate their care with an outside provider. Patient/Guardian was advised if they have not already done so to contact the registration department to sign all necessary forms in order for us  to release information regarding their care.   Consent: Patient/Guardian gives verbal consent for treatment and assignment of benefits for services provided during this visit. Patient/Guardian expressed understanding and agreed to proceed.  This note was generated in part or whole with voice recognition software. Voice recognition is usually quite accurate but there are transcription errors that can and very often do occur. I apologize for any typographical errors that were not detected and  corrected.     Danica Camarena, MD 11/27/2023, 11:01 AM

## 2024-04-28 ENCOUNTER — Encounter: Payer: Self-pay | Admitting: Psychiatry

## 2024-04-28 ENCOUNTER — Telehealth: Admitting: Psychiatry

## 2024-04-28 DIAGNOSIS — F209 Schizophrenia, unspecified: Secondary | ICD-10-CM

## 2024-04-28 NOTE — Progress Notes (Signed)
 Virtual Visit via Video Note  I connected with William Oconnor on 04/28/24 at 11:40 AM EDT by a video enabled telemedicine application and verified that I am speaking with the correct person using two identifiers.  Location Provider Location : [facility] Patient Location : Home  Participants: Patient , Provider   I discussed the limitations of evaluation and management by telemedicine and the availability of in person appointments. The patient expressed understanding and agreed to proceed.   I discussed the assessment and treatment plan with the patient. The patient was provided an opportunity to ask questions and all were answered. The patient agreed with the plan and demonstrated an understanding of the instructions.   The patient was advised to call back or seek an in-person evaluation if the symptoms worsen or if the condition fails to improve as anticipated.   BH MD OP Progress Note  04/28/2024 12:40 PM William Oconnor  MRN:  [id number]  Chief Complaint:  Chief Complaint  Patient presents with   Follow-up   Medication Refill   Schizophrenia   Discussed the use of AI scribe software for clinical note transcription with the patient, who gave verbal consent to proceed.  History of Present Illness William Oconnor is a 48 year old African-American male, single, lives in [place], on disability, has a history of schizophrenia, hypertension, hyperlipidemia was evaluated by telemedicine today for a follow-up appointment.  Since his last visit, he reports stability in his symptoms. He denies experiencing depression, anxiety, auditory or visual hallucinations, paranoia, suicidal ideation, or homicidal ideation. He describes no current problems with mood or psychosis.  He continues to take risperidone  4 mg daily at bedtime for schizophrenia and uses benztropine  1 mg as needed for tremors, but not on a daily basis. He does not report any issues with his current medication regimen.  He  acknowledges that maintaining healthy habits, such as exercise and healthy eating, can be challenging.  He denies any other concerns today.   Visit Diagnosis:    ICD-10-CM   1. Schizophrenia, chronic condition (HCC)  F20.9       Past Psychiatric History: I have reviewed past psychiatric history from progress note on 04/28/2020.  Past trials of medications like risperidone , Cogentin   Past Medical History:  Past Medical History:  Diagnosis Date   Asthma    Elevated PSA    Hyperlipidemia    Hypertension    Schizophrenia (HCC)     Past Surgical History:  Procedure Laterality Date   COLONOSCOPY WITH PROPOFOL  N/A 05/19/2021   Procedure: COLONOSCOPY WITH PROPOFOL ;  Surgeon: Maryruth Ole DASEN, MD;  Location: [facility] ENDOSCOPY;  Service: Endoscopy;  Laterality: N/A;    Family Psychiatric History: I have reviewed family psychiatric history from progress note on 04/28/2020  Family History:  Family History  Problem Relation Age of Onset   Schizophrenia Mother     Social History: I have reviewed social history from progress note on 04/28/2020 Social History   Socioeconomic History   Marital status: Single    Spouse name: Not on file   Number of children: Not on file   Years of education: 46 TH GRADE   Highest education level: Not on file  Occupational History   Not on file  Tobacco Use   Smoking status: Former    Current packs/day: 0.00    Types: Cigarettes, Cigars    Quit date: 04/28/2010    Years since quitting: 14.0   Smokeless tobacco: Never  Vaping Use   Vaping status: Never  Used  Substance and Sexual Activity   Alcohol use: Not Currently   Drug use: Not Currently    Types: Marijuana   Sexual activity: Yes  Other Topics Concern   Not on file  Social History Narrative   Not on file   Social Drivers of Health   Financial Resource Strain: Low Risk  (03/30/2024)   Received from [facility]   Overall Financial Resource Strain (CARDIA)     Difficulty of Paying Living Expenses: Not very hard  Food Insecurity: Food Insecurity Present (03/30/2024)   [facility]   Hunger Vital Sign    Within the past 12 months, you worried that your food would run out before you got the money to buy more.: Never true    Within the past 12 months, the food you bought just didn't last and you didn't have money to get more.: Sometimes true  Transportation Needs: No Transportation Needs (03/30/2024)   Received from [facility] - [facility]    In the past 12 months, has lack of transportation kept you from medical appointments or from getting medications?: No    Lack of Transportation (Non-Medical): No  Physical Activity: Not on file  Stress: Not on file  Social Connections: Not on file    Allergies:  Allergies  Allergen Reactions   Atorvastatin Other (See Comments)    Transaminitis     Metabolic Disorder Labs: Lab Results  Component Value Date   HGBA1C 5.4 06/13/2023   MPG 108.28 06/13/2023   Lab Results  Component Value Date   PROLACTIN 13.4 06/13/2023   PROLACTIN 16.9 (H) 12/20/2020   No results found for: CHOL, TRIG, HDL, CHOLHDL, VLDL, LDLCALC Lab Results  Component Value Date   TSH 3.408 06/13/2023   TSH 1.009 Test methodology is 3rd generation TSH 07/31/2009    Therapeutic Level Labs: No results found for: LITHIUM No results found for: VALPROATE No results found for: CBMZ  Current Medications: Current Outpatient Medications  Medication Sig Dispense Refill   albuterol  (VENTOLIN  HFA) 108 (90 Base) MCG/ACT inhaler Inhale into the lungs.     amLODipine (NORVASC) 10 MG tablet Take 1 tablet by mouth daily.     benztropine  (COGENTIN ) 1 MG tablet TAKE 1 TABLET BY MOUTH  DAILY AS NEEDED FOR TREMORS AND SIDE EFFECTS OF  RISPERIDONE  90 tablet 3   BOOSTRIX 5-2.5-18.5 LF-MCG/0.5 injection      Calcium Carbonate-Vitamin D 600-400 MG-UNIT tablet Take 2  tablets by mouth daily.     FLUCELVAX 0.5 ML injection      fluticasone (FLONASE) 50 MCG/ACT nasal spray Place into the nose.     fluticasone-salmeterol (ADVAIR) 100-50 MCG/ACT AEPB Inhale into the lungs.     pantoprazole (PROTONIX) 20 MG tablet Take 1 tablet by mouth 2 (two) times daily before a meal.     polyethylene glycol-electrolytes (NULYTELY) 420 g solution Take 4,000 mLs by mouth as directed.     pravastatin (PRAVACHOL) 40 MG tablet Take 1 tablet by mouth at bedtime.     risperidone  (RISPERDAL ) 4 MG tablet Take 1 tablet (4 mg total) by mouth at bedtime. 90 tablet 3   WIXELA INHUB 100-50 MCG/ACT AEPB Inhale into the lungs.     No current facility-administered medications for this visit.     Musculoskeletal: Strength & Muscle Tone: UTA Gait & Station: Seated Patient leans: N/A  Psychiatric Specialty Exam: Review of Systems  Psychiatric/Behavioral: Negative.  There were no vitals taken for this visit.There is no height or weight on file to calculate BMI.  General Appearance: Casual  Eye Contact:  Fair  Speech:  Clear and Coherent  Volume:  Normal  Mood:  Euthymic  Affect:  Congruent  Thought Process:  Goal Directed and Descriptions of Associations: Intact  Orientation:  Full (Time, Place, and Person)  Thought Content: Logical   Suicidal Thoughts:  No  Homicidal Thoughts:  No  Memory:  Immediate;   Fair Recent;   Fair Remote;   Fair  Judgement:  Fair  Insight:  Fair  Psychomotor Activity:  Normal  Concentration:  Concentration: Poor and Attention Span: Poor  Recall:  [employer] of Knowledge: Fair  Language: Fair  Akathisia:  No  Handed:  Right  AIMS (if indicated): denies tremors, abnormal involuntary movements  Assets:  Manufacturing systems engineer Desire for Improvement Housing Social Support Transportation  ADL's:  Intact  Cognition: WNL  Sleep:  Fair   Screenings: AIMS    Flowsheet Row Video Visit from 04/28/2024 in [facility] Office Visit from 11/27/2023 in [facility] Office Visit from 05/30/2023 in [facility] Office Visit from 09/13/2022 in [facility] Video Visit from 05/15/2022 in [facility]  AIMS Total Score 0 0 0 0 0   GAD-7    Flowsheet Row Office Visit from 11/27/2023 in [facility] Office Visit from 05/30/2023 in [facility] Office Visit from 01/23/2022 in [facility] Video Visit from 07/18/2021 in [facility]  Total GAD-7 Score 0 1 0 0   PHQ2-9    Flowsheet Row Office Visit from 11/27/2023 in [facility] Office Visit from 05/30/2023 in [facility] Video Visit from 05/15/2022 in [facility] Office Visit from 01/23/2022 in [facility] Office Visit from 10/24/2021 in [facility]  PHQ-2 Total Score 0 0 0 0 0   Flowsheet Row Video Visit from 04/28/2024 in [facility] Office Visit from 11/27/2023 in [facility] Office Visit from 05/30/2023 in [facility]  C-SSRS RISK CATEGORY No Risk No Risk No Risk     Assessment and Plan: William Oconnor is a 47 year old African-American male who has a history of schizophrenia, hypertension, hyperlipidemia was evaluated by telemedicine today.  Discussed assessment and plan as noted below.  1. Schizophrenia, chronic condition (HCC)-stable Currently denies any significant concerns well-managed on current medication regimen  and reports no side effects. Continue Risperidone  4 mg at bedtime Continue Cogentin  1 mg daily as needed  Follow-up Follow-up in clinic in 5 to 6 months or sooner in person.   Collaboration of Care: Collaboration of Care: Primary Care Provider AEB encouraged to continue follow-up with primary care provider  Patient/Guardian was advised Release of Information must be obtained prior to any record release in order to collaborate their care with an outside provider. Patient/Guardian was advised if they have not already done so to contact the registration department to sign all necessary forms in order for us  to release information regarding their care.   Consent: Patient/Guardian gives verbal consent for treatment and assignment of benefits for services provided during this visit.  Patient/Guardian expressed understanding and agreed to proceed.  This note was generated in part or whole with voice recognition software. Voice recognition is usually quite accurate but there are transcription errors that can and very often do occur. I apologize for any typographical errors that were not detected and corrected.     Heinrich Fertig, MD 04/28/2024, 12:40 PM

## 2024-07-01 ENCOUNTER — Telehealth: Payer: Self-pay

## 2024-07-01 NOTE — Telephone Encounter (Signed)
 Received fax from patients pharmacy requesting a refill of risperidone  (RISPERDAL ) 4 MG tablet     Last visit 04-28-24 Next visit 09-10-24     [facility] - [place], [place] - [address] Phone: [phone]  Fax: [phone]

## 2024-07-01 NOTE — Telephone Encounter (Signed)
 Please cancel any pending prescription at [facility] pharmacy for risperidone .  Please let me know once that is completed so I can send a new prescription to [facility] if patient is requesting this to be sent to a new pharmacy.

## 2024-07-03 NOTE — Telephone Encounter (Signed)
 I have tried to call the [facility] pharmacy and can not get through one option stated that they were closed and the other option just disconnected me so I will try again on Monday

## 2024-07-03 NOTE — Telephone Encounter (Signed)
 Noted

## 2024-07-06 NOTE — Telephone Encounter (Signed)
 Spoke to patient his insurance will change at the beginning of the year he will then have a insurance by the name of [employer] and will then switch his pharmacy to [facility] he will no longer use [facility] pharmacy has been updated in the chart

## 2024-07-07 NOTE — Telephone Encounter (Signed)
 Noted

## 2024-07-21 ENCOUNTER — Telehealth: Payer: Self-pay

## 2024-07-21 DIAGNOSIS — F209 Schizophrenia, unspecified: Secondary | ICD-10-CM

## 2024-07-21 NOTE — Telephone Encounter (Signed)
 Please check with patient if his pharmacy has changed since he had mentioned that at the beginning of the year his health insurance plan might change and that he will be changing his pharmacy to [facility].

## 2024-07-21 NOTE — Telephone Encounter (Signed)
 Pharmacy fax requesting risperidone  (RISPERDAL ) 4 MG tabl  Pharmacy:[facility]) [place], [place] - [address]   Last seen:04/28/24 Next apt:  09/10/2024

## 2024-07-22 ENCOUNTER — Other Ambulatory Visit: Payer: Self-pay | Admitting: Psychiatry

## 2024-07-22 DIAGNOSIS — F209 Schizophrenia, unspecified: Secondary | ICD-10-CM

## 2024-07-22 MED ORDER — RISPERIDONE 4 MG PO TABS: 4.0000 mg | ORAL_TABLET | Freq: Every day | ORAL | 3 refills | Status: AC

## 2024-07-22 NOTE — Addendum Note (Signed)
 Addended byBETHA COBY HEIGHT on: 07/22/2024 12:59 PM   Modules accepted: Orders

## 2024-07-22 NOTE — Telephone Encounter (Signed)
 I have sent risperidone  to pharmacy at [facility].

## 2024-08-05 ENCOUNTER — Telehealth: Payer: Self-pay

## 2024-08-05 DIAGNOSIS — F209 Schizophrenia, unspecified: Secondary | ICD-10-CM

## 2024-08-05 MED ORDER — BENZTROPINE MESYLATE 1 MG PO TABS: ORAL_TABLET | ORAL | 3 refills | Status: AC

## 2024-08-05 NOTE — Telephone Encounter (Signed)
 Patient called to request a refill of benztropine  (COGENTIN ) 1 MG tablet    Last visit 04-28-24 Next visit 09-10-24   Preferred Pharmacies   [facility] - [place], [place] - [facility] AT Portal to [facility] Phone: [phone]  Fax: [phone]

## 2024-08-05 NOTE — Addendum Note (Signed)
 Addended byBETHA COBY HEIGHT on: 08/05/2024 09:52 AM   Modules accepted: Orders

## 2024-08-05 NOTE — Telephone Encounter (Signed)
 I have sent benztropine  to pharmacy as requested.

## 2024-09-10 ENCOUNTER — Ambulatory Visit: Admitting: Psychiatry
